# Patient Record
Sex: Female | Born: 1980 | Race: Black or African American | Hispanic: No | Marital: Married | Smoking: Never smoker
Health system: Southern US, Community
[De-identification: ages and names within clinical notes are randomized; demographics above are authoritative.]

## PROBLEM LIST (undated history)

## (undated) DIAGNOSIS — E282 Polycystic ovarian syndrome: Secondary | ICD-10-CM

## (undated) DIAGNOSIS — Z202 Contact with and (suspected) exposure to infections with a predominantly sexual mode of transmission: Secondary | ICD-10-CM

## (undated) DIAGNOSIS — G43909 Migraine, unspecified, not intractable, without status migrainosus: Secondary | ICD-10-CM

## (undated) HISTORY — DX: Contact with and (suspected) exposure to infections with a predominantly sexual mode of transmission: Z20.2

## (undated) HISTORY — DX: Polycystic ovarian syndrome: E28.2

## (undated) HISTORY — DX: Migraine, unspecified, not intractable, without status migrainosus: G43.909

## (undated) HISTORY — PX: NO PAST SURGERIES: SHX2092

---

## 2002-03-18 ENCOUNTER — Other Ambulatory Visit: Admission: RE | Admit: 2002-03-18 | Discharge: 2002-03-18 | Payer: Self-pay | Admitting: Obstetrics and Gynecology

## 2004-05-03 ENCOUNTER — Other Ambulatory Visit: Admission: RE | Admit: 2004-05-03 | Discharge: 2004-05-03 | Payer: Self-pay | Admitting: Obstetrics and Gynecology

## 2005-07-19 ENCOUNTER — Emergency Department (HOSPITAL_COMMUNITY): Admission: EM | Admit: 2005-07-19 | Discharge: 2005-07-19 | Payer: Self-pay | Admitting: Emergency Medicine

## 2005-08-14 DIAGNOSIS — G43909 Migraine, unspecified, not intractable, without status migrainosus: Secondary | ICD-10-CM

## 2005-08-14 HISTORY — DX: Migraine, unspecified, not intractable, without status migrainosus: G43.909

## 2006-02-09 ENCOUNTER — Other Ambulatory Visit: Admission: RE | Admit: 2006-02-09 | Discharge: 2006-02-09 | Payer: Self-pay | Admitting: Obstetrics & Gynecology
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
## 2007-08-15 DIAGNOSIS — Z202 Contact with and (suspected) exposure to infections with a predominantly sexual mode of transmission: Secondary | ICD-10-CM

## 2007-08-15 HISTORY — DX: Contact with and (suspected) exposure to infections with a predominantly sexual mode of transmission: Z20.2

## 2008-03-23 ENCOUNTER — Other Ambulatory Visit: Admission: RE | Admit: 2008-03-23 | Discharge: 2008-03-23 | Payer: Self-pay | Admitting: Obstetrics and Gynecology

## 2008-05-18 ENCOUNTER — Other Ambulatory Visit: Admission: RE | Admit: 2008-05-18 | Discharge: 2008-05-18 | Payer: Self-pay | Admitting: Obstetrics and Gynecology

## 2012-11-27 ENCOUNTER — Ambulatory Visit (INDEPENDENT_AMBULATORY_CARE_PROVIDER_SITE_OTHER): Payer: BC Managed Care – PPO | Admitting: Certified Nurse Midwife

## 2012-11-27 ENCOUNTER — Encounter: Payer: Self-pay | Admitting: Certified Nurse Midwife

## 2012-11-27 VITALS — BP 94/60 | Ht 61.25 in | Wt 109.0 lb

## 2012-11-27 DIAGNOSIS — Z01419 Encounter for gynecological examination (general) (routine) without abnormal findings: Secondary | ICD-10-CM

## 2012-11-27 DIAGNOSIS — Z0000 Encounter for general adult medical examination without abnormal findings: Secondary | ICD-10-CM

## 2012-11-27 LAB — POCT URINALYSIS DIPSTICK
Bilirubin, UA: NEGATIVE
Blood, UA: NEGATIVE
Glucose, UA: NEGATIVE
Ketones, UA: NEGATIVE
Urobilinogen, UA: NEGATIVE

## 2012-11-27 LAB — CBC
MCV: 91.2 fL (ref 78.0–100.0)
Platelets: 293 10*3/uL (ref 150–400)
RDW: 13.1 % (ref 11.5–15.5)
WBC: 2.3 10*3/uL — ABNORMAL LOW (ref 4.0–10.5)

## 2012-11-27 LAB — HIV ANTIBODY (ROUTINE TESTING W REFLEX): HIV: NONREACTIVE

## 2012-11-27 LAB — RPR

## 2012-11-27 NOTE — Progress Notes (Signed)
32 y.o. Single African American female   G0P0000 here for annual exam.  Periods normal with cycles  Every 28 to 36 days, duration 4-5 days, moderate to light.  No issues.  No health issues in past year or today. Teaching going well. 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  Patient's last menstrual period was 10/21/2012.          Sexually active: no  The current method of family planning is abstinence.    Exercising: yes  running & walking Last mammogram: none Last pap: 05-05-10 neg Last BMD: none Alcohol: none Tobacco: none colonoscopy   No health maintenance topics applied.  Family History  Problem Relation Age of Onset  . Diabetes Mother   . Hypertension Father   . Hyperlipidemia Father   . Diabetes Maternal Grandmother   . Diabetes Paternal Grandfather     There is no problem list on file for this patient.   Past Medical History  Diagnosis Date  . Migraines 2007    per pt none now  . Chlamydia contact, treated 2009    History reviewed. No pertinent past surgical history.  Allergies: Review of patient's allergies indicates no known allergies.  No current outpatient prescriptions on file.   No current facility-administered medications for this visit.    ROS: Pertinent items are noted in HPI.  Exam:    BP 94/60  Ht 5' 1.25" (1.556 m)  Wt 109 lb (49.442 kg)  BMI 20.42 kg/m2  LMP 10/21/2012 Weight change: @WEIGHTCHANGE @ Last 3 height recordings:  Ht Readings from Last 3 Encounters:  11/27/12 5' 1.25" (1.556 m)   General appearance: alert and cooperative Head: Normocephalic, without obvious abnormality, atraumatic Neck: no adenopathy, supple, symmetrical, trachea midline and thyroid not enlarged, symmetric, no tenderness/mass/nodules Lungs: clear to auscultation bilaterally Breasts: normal appearance, no masses or tenderness Heart: regular rate and rhythm Abdomen: soft, non-tender; bowel sounds normal; no masses,  no  organomegaly Extremities: extremities normal, atraumatic, no cyanosis or edema Skin: Skin color, texture, turgor normal. No rashes or lesions Lymph nodes: Cervical, supraclavicular, and axillary nodes normal. no inguinal nodes palpated Neurologic: Alert and oriented X 3, normal strength and tone. Normal symmetric reflexes. Normal coordination and gait   Pelvic: External genitalia:  no lesions              Urethra: normal appearing urethra with no masses, tenderness or lesions              Bartholins and Skenes: Bartholin's, Urethra, Skene's normal                 Vagina: normal appearing vagina with normal color and discharge, no lesions              Cervix: normal appearance very posterior              Pap taken: yes        Bimanual Exam:  Uterus:  uterus is normal size, shape, consistency and nontender, anteflexed                                      Adnexa:    normal adnexa in size, nontender and no masses  Rectovaginal: Confirms                                      Anus:  normal sphincter tone, no lesions  A: Well woman exam Contraception none needed Desires STD screening     P: Reviewed health and wellness pertinent to exam Pap smear as indicated Lab: HIV,RPR,Hepatitis C  CBC  return annually or prn      An After Visit Summary was printed and given to the patient.  Reviewed, TL

## 2012-11-27 NOTE — Patient Instructions (Signed)

## 2012-11-28 ENCOUNTER — Telehealth: Payer: Self-pay | Admitting: *Deleted

## 2012-11-28 LAB — HEMOGLOBIN, FINGERSTICK: Hemoglobin, fingerstick: 13.9 g/dL (ref 12.0–16.0)

## 2012-11-28 NOTE — Telephone Encounter (Signed)
Left Message To Call Back  

## 2012-11-28 NOTE — Telephone Encounter (Signed)
Patient aware of results.

## 2012-11-28 NOTE — Telephone Encounter (Signed)
Message copied by Lorraine Lax on Thu Nov 28, 2012  3:18 PM ------      Message from: Verner Chol      Created: Thu Nov 28, 2012  8:35 AM       Notify patient RPR, HIV and Hep C negative      CBC all normal except WBC slightly low, needs repeat in 2 weeks ------

## 2012-11-29 LAB — IPS PAP TEST WITH REFLEX TO HPV

## 2012-12-20 ENCOUNTER — Other Ambulatory Visit (INDEPENDENT_AMBULATORY_CARE_PROVIDER_SITE_OTHER): Payer: BC Managed Care – PPO

## 2012-12-20 ENCOUNTER — Other Ambulatory Visit: Payer: Self-pay | Admitting: Certified Nurse Midwife

## 2012-12-20 DIAGNOSIS — R6889 Other general symptoms and signs: Secondary | ICD-10-CM

## 2012-12-21 LAB — CBC
MCH: 30.5 pg (ref 26.0–34.0)
MCHC: 34.4 g/dL (ref 30.0–36.0)
Platelets: 286 10*3/uL (ref 150–400)
RBC: 4.07 MIL/uL (ref 3.87–5.11)

## 2012-12-23 ENCOUNTER — Telehealth: Payer: Self-pay

## 2012-12-23 NOTE — Telephone Encounter (Signed)
lmtcb to give lab results. 

## 2012-12-23 NOTE — Telephone Encounter (Signed)
Message copied by Eliezer Bottom on Mon Dec 23, 2012  1:34 PM ------      Message from: Verner Chol      Created: Mon Dec 23, 2012  8:18 AM       Notify CBC normal, WBC count normal now       ------

## 2012-12-24 NOTE — Telephone Encounter (Signed)
Patient notified of results.

## 2013-08-27 ENCOUNTER — Encounter: Payer: Self-pay | Admitting: Certified Nurse Midwife

## 2013-08-27 ENCOUNTER — Ambulatory Visit (INDEPENDENT_AMBULATORY_CARE_PROVIDER_SITE_OTHER): Payer: BC Managed Care – PPO | Admitting: Certified Nurse Midwife

## 2013-08-27 VITALS — BP 100/64 | HR 64 | Resp 16 | Ht 61.25 in | Wt 114.0 lb

## 2013-08-27 DIAGNOSIS — Z202 Contact with and (suspected) exposure to infections with a predominantly sexual mode of transmission: Secondary | ICD-10-CM

## 2013-08-27 DIAGNOSIS — Z3009 Encounter for other general counseling and advice on contraception: Secondary | ICD-10-CM

## 2013-08-27 NOTE — Progress Notes (Signed)
  32 yrs Single [place] female G0P0000 here for STD screening. Patient in new relationship and partner and she plan to share results with each other. No concerns of STD exposure. Patient denies any vaginal concerns or health concerns. Patient would also like information on Depo Provera. She desires contraception and interested in Depo.   Patient's last menstrual period was 08/14/2013.    Contraception: not sexually active                                            General:Healthy female WDWN Affect: normal, orientation X 3  Abdomen: soft non-tender Lymph groin:Normal   Pelvic Exam:EGBUS normal. Vulva reveals no erythema, lesions or atrophic changes. Vagina normal, no lesions, polyps or discharge.  Cervix is normal, smooth pink mucosa, no friability, nontender, no CMT, no lesions, no polyps.  Uterus:normal:  Adnexae:; not tender no masses Perineal area: no lesions noted Rectal: no lesions noted Specimens collected                    Assessment: Normal Pelvic Exam              Contraception:condoms desires Depo Provera STD Screening     Plan :Reviewed findings of normal exam Discussed risks and benefits of Depo Provera, bleeding profile expectations, possible weight change and can affect time of return to fertility with stops. Questions addressed. Patient to call on first day of period to schedule to come in for  Depo Provera. Rx Depo Provera every 3 months x 3 refills Labs: GC,Chl,HIV RPR            STD prevention reviewed.  Questions addressed.             RV prn    :   :      :

## 2013-08-28 LAB — HIV ANTIBODY (ROUTINE TESTING W REFLEX): HIV: NONREACTIVE

## 2013-08-28 LAB — HSV(HERPES SIMPLEX VRS) I + II AB-IGG
HSV 1 Glycoprotein G Ab, IgG: 0.11 IV
HSV 2 GLYCOPROTEIN G AB, IGG: 0.21 IV

## 2013-08-28 LAB — HEPATITIS C ANTIBODY: HCV AB: NEGATIVE

## 2013-08-28 LAB — RPR

## 2013-08-29 LAB — IPS N GONORRHOEA AND CHLAMYDIA BY PCR

## 2013-08-31 NOTE — Progress Notes (Signed)
Reviewed personally.  M. Suzanne Glenford Garis, MD.  

## 2013-09-01 ENCOUNTER — Telehealth: Payer: Self-pay

## 2013-09-01 NOTE — Telephone Encounter (Signed)
Message copied by Eliezer BottomJOHNSON, DAVINA J on Mon Sep 01, 2013 10:26 AM ------      Message from: Verner CholLEONARD, DEBORAH S      Created: Mon Sep 01, 2013  8:34 AM       Notify patient that HIV,RPR,GC,Chlamydia, HSV 1,2 and Hep.C all negative ------

## 2013-09-01 NOTE — Telephone Encounter (Signed)
lmtcb

## 2013-09-01 NOTE — Telephone Encounter (Signed)
Patient notified of results.

## 2013-09-23 ENCOUNTER — Ambulatory Visit (INDEPENDENT_AMBULATORY_CARE_PROVIDER_SITE_OTHER): Payer: BC Managed Care – PPO | Admitting: *Deleted

## 2013-09-23 VITALS — BP 120/72 | HR 76 | Ht 61.25 in | Wt 110.0 lb

## 2013-09-23 DIAGNOSIS — Z3040 Encounter for surveillance of contraceptives, unspecified: Secondary | ICD-10-CM

## 2013-09-23 LAB — POCT URINE PREGNANCY: PREG TEST UR: NEGATIVE

## 2013-09-23 MED ORDER — MEDROXYPROGESTERONE ACETATE 150 MG/ML IM SUSP
150.0000 mg | Freq: Once | INTRAMUSCULAR | Status: AC
  Administered 2013-09-23: 150 mg via INTRAMUSCULAR

## 2013-09-23 NOTE — Progress Notes (Signed)
Patient ID: Sabrina SailsAlexis Richardson, female   DOB: 11/01/1980, 33 y.o.   MRN: [id number] Pt arrived for first Depo Provera injection.  Pt LMP 09/20/13 and UPT in office today was negative.  Pt has not been SA since last LMP.  Pt tolerated injection well in right gluteal. Last AEX - 11/27/12 Pt should return between 12/09/13 and 12/23/13.  Pt has AEX appt on 12/04/13.  Advised pt on date range for next injection.  Explained to pt that she can be a few days early for injection, but she can never be late for injection.  Pt voices understanding.  Advised pt OK to have injection at [facility] that is already scheduled.

## 2013-12-03 ENCOUNTER — Telehealth: Payer: Self-pay | Admitting: Certified Nurse Midwife

## 2013-12-03 NOTE — Telephone Encounter (Signed)
LMTCB to reschedule her AEX with D. [facility]

## 2013-12-03 NOTE — Telephone Encounter (Signed)
Patient called and rescheduled

## 2013-12-04 ENCOUNTER — Ambulatory Visit: Payer: BC Managed Care – PPO | Admitting: Certified Nurse Midwife

## 2013-12-18 ENCOUNTER — Encounter: Payer: Self-pay | Admitting: Certified Nurse Midwife

## 2013-12-18 ENCOUNTER — Ambulatory Visit (INDEPENDENT_AMBULATORY_CARE_PROVIDER_SITE_OTHER): Payer: BC Managed Care – PPO | Admitting: Certified Nurse Midwife

## 2013-12-18 VITALS — BP 110/68 | HR 72 | Resp 16 | Ht 61.5 in | Wt 111.0 lb

## 2013-12-18 DIAGNOSIS — Z3041 Encounter for surveillance of contraceptive pills: Secondary | ICD-10-CM

## 2013-12-18 DIAGNOSIS — Z3040 Encounter for surveillance of contraceptives, unspecified: Secondary | ICD-10-CM

## 2013-12-18 DIAGNOSIS — Z124 Encounter for screening for malignant neoplasm of cervix: Secondary | ICD-10-CM

## 2013-12-18 DIAGNOSIS — Z01419 Encounter for gynecological examination (general) (routine) without abnormal findings: Secondary | ICD-10-CM

## 2013-12-18 DIAGNOSIS — Z309 Encounter for contraceptive management, unspecified: Secondary | ICD-10-CM

## 2013-12-18 DIAGNOSIS — Z0000 Encounter for general adult medical examination without abnormal findings: Secondary | ICD-10-CM

## 2013-12-18 LAB — POCT URINALYSIS DIPSTICK
Bilirubin, UA: NEGATIVE
Blood, UA: NEGATIVE
GLUCOSE UA: NEGATIVE
KETONES UA: NEGATIVE
LEUKOCYTES UA: NEGATIVE
Nitrite, UA: NEGATIVE
Protein, UA: NEGATIVE
Urobilinogen, UA: NEGATIVE
pH, UA: 5

## 2013-12-18 LAB — HEMOGLOBIN, FINGERSTICK: HEMOGLOBIN, FINGERSTICK: 13.4 g/dL (ref 12.0–16.0)

## 2013-12-18 MED ORDER — MEDROXYPROGESTERONE ACETATE 150 MG/ML IM SUSP: 150.0000 mg | INTRAMUSCULAR | Status: DC

## 2013-12-18 MED ORDER — MEDROXYPROGESTERONE ACETATE 150 MG/ML IM SUSP
150.0000 mg | Freq: Once | INTRAMUSCULAR | Status: AC
  Administered 2013-12-18: 150 mg via INTRAMUSCULAR

## 2013-12-18 NOTE — Progress Notes (Signed)
33 y.o. G0P0000 Single African American Fe here for annual exam. Periods normal, no issues just spotting. No partner change, no STD screening needed. Happy with Depo Provera use for contraception. Due today. Sees PCP only prn. Desires screening labs today. No other health issues today.  Patient's last menstrual period was 12/14/2013.          Sexually active: yes  The current method of family planning is Depo-Provera injections.    Exercising: yes  walking,running & cross train Smoker:  no  Health Maintenance: Pap: 11-27-12 neg MMG:  none Colonoscopy:  none BMD:   none TDaP:  2013 Labs: Poct urine-neg, Hgb-13.4 Self breast exam: done occ   reports that she has never smoked. She does not have any smokeless tobacco history on file. She reports that she does not drink alcohol or use illicit drugs.  Past Medical History  Diagnosis Date  . Migraines 2007    per pt none now  . Chlamydia contact, treated 2009    History reviewed. No pertinent past surgical history.  Current Outpatient Prescriptions  Medication Sig Dispense Refill  . medroxyPROGESTERone (DEPO-PROVERA) 150 MG/ML injection Inject 150 mg into the muscle every 3 (three) months.       No current facility-administered medications for this visit.    Family History  Problem Relation Age of Onset  . Diabetes Mother   . Hypertension Father   . Hyperlipidemia Father   . Diabetes Maternal Grandmother   . Diabetes Paternal Grandfather     ROS:  Pertinent items are noted in HPI.  Otherwise, a comprehensive ROS was negative.  Exam:   BP 110/68  Pulse 72  Resp 16  Ht 5' 1.5" (1.562 m)  Wt 111 lb (50.349 kg)  BMI 20.64 kg/m2  LMP 12/14/2013 Height: 5' 1.5" (156.2 cm)  Ht Readings from Last 3 Encounters:  12/18/13 5' 1.5" (1.562 m)  09/23/13 5' 1.25" (1.556 m)  08/27/13 5' 1.25" (1.556 m)    General appearance: alert, cooperative and appears stated age Head: Normocephalic, without obvious abnormality,  atraumatic Neck: no adenopathy, supple, symmetrical, trachea midline and thyroid normal to inspection and palpation Lungs: clear to auscultation bilaterally Breasts: normal appearance, no masses or tenderness, No nipple retraction or dimpling, No nipple discharge or bleeding, No axillary or supraclavicular adenopathy Heart: regular rate and rhythm Abdomen: soft, non-tender; no masses,  no organomegaly Extremities: extremities normal, atraumatic, no cyanosis or edema Skin: Skin color, texture, turgor normal. No rashes or lesions Lymph nodes: Cervical, supraclavicular, and axillary nodes normal. No abnormal inguinal nodes palpated Neurologic: Grossly normal   Pelvic: External genitalia:  no lesions              Urethra:  normal appearing urethra with no masses, tenderness or lesions              Bartholin's and Skene's: normal                 Vagina: normal appearing vagina with normal color and discharge, no lesions, small superficial laceration at introitus, non tender, no bleeding noted, non tender              Cervix: normal, non tender              Pap taken: yes Bimanual Exam:  Uterus:  normal size, contour, position, consistency, mobility, non-tender, anteverted and anteflexed              Adnexa: normal adnexa and no mass, fullness, tenderness  Rectovaginal: Confirms               Anus:  normal sphincter tone, no lesions  A:  Well Woman with normal exam  Contraception Depo Provera desired  Screening labs  Vaginal superficial laceration  P:   Reviewed health and wellness pertinent to exam  Rx Depo Provera 150 mg IM every 3 months x 1 year  Pap smear taken today with HPVHR  Labs: Lipid panel, TSH  Patient shown findings and encouraged to decrease hand stimulation to avoid trauma to area with stimulation and can use Olive Oil to area to soothe until healed. Questions addressed.   counseled on breast self exam, STD prevention, HIV risk factors and prevention,  adequate intake of calcium and vitamin D, diet and exercise  return annually or prn  An After Visit Summary was printed and given to the patient.

## 2013-12-18 NOTE — Patient Instructions (Signed)
General topics  Next pap or exam is  due in 1 year Take a Women's multivitamin Take 1200 mg. of calcium daily - prefer dietary If any concerns in interim to call back  Breast Self-Awareness Practicing breast self-awareness may pick up problems early, prevent significant medical complications, and possibly save your life. By practicing breast self-awareness, you can become familiar with how your breasts look and feel and if your breasts are changing. This allows you to notice changes early. It can also offer you some reassurance that your breast health is good. One way to learn what is normal for your breasts and whether your breasts are changing is to do a breast self-exam. If you find a lump or something that was not present in the past, it is best to contact your caregiver right away. Other findings that should be evaluated by your caregiver include nipple discharge, especially if it is bloody; skin changes or reddening; areas where the skin seems to be pulled in (retracted); or new lumps and bumps. Breast pain is seldom associated with cancer (malignancy), but should also be evaluated by a caregiver. BREAST SELF-EXAM The best time to examine your breasts is 5 7 days after your menstrual period is over.  ExitCare Patient Information 2013 [employer].   Exercise to Stay Healthy Exercise helps you become and stay healthy. EXERCISE IDEAS AND TIPS Choose exercises that:  You enjoy.  Fit into your day. You do not need to exercise really hard to be healthy. You can do exercises at a slow or medium level and stay healthy. You can:  Stretch before and after working out.  Try yoga, Pilates, or tai chi.  Lift weights.  Walk fast, swim, jog, run, climb stairs, bicycle, dance, or rollerskate.  Take aerobic classes. Exercises that burn about 150 calories:  Running 1  miles in 15 minutes.  Playing volleyball for 45 to 60 minutes.  Washing and waxing a car for 45 to 60  minutes.  Playing touch football for 45 minutes.  Walking 1  miles in 35 minutes.  Pushing a stroller 1  miles in 30 minutes.  Playing basketball for 30 minutes.  Raking leaves for 30 minutes.  Bicycling 5 miles in 30 minutes.  Walking 2 miles in 30 minutes.  Dancing for 30 minutes.  Shoveling snow for 15 minutes.  Swimming laps for 20 minutes.  Walking up stairs for 15 minutes.  Bicycling 4 miles in 15 minutes.  Gardening for 30 to 45 minutes.  Jumping rope for 15 minutes.  Washing windows or floors for 45 to 60 minutes. Document Released: 09/02/2010 Document Revised: 10/23/2011 Document Reviewed: 09/02/2010 ExitCare Patient Information 2013 ExitCare[employer].   Other topics ( that may be useful information):    Sexually Transmitted Disease Sexually transmitted disease (STD) refers to any infection that is passed from person to person during sexual activity. This may happen by way of saliva, semen, blood, vaginal mucus, or urine. Common STDs include:  Gonorrhea.  Chlamydia.  Syphilis.  HIV/AIDS.  Genital herpes.  Hepatitis B and C.  Trichomonas.  Human papillomavirus (HPV).  Pubic lice. CAUSES  An STD may be spread by bacteria, virus, or parasite. A person can get an STD by:  Sexual intercourse with an infected person.  Sharing sex toys with an infected person.  Sharing needles with an infected person.  Having intimate contact with the genitals, mouth, or rectal areas of an infected person. SYMPTOMS  Some people may not have any symptoms, but   they can still pass the infection to others. Different STDs have different symptoms. Symptoms include:  Painful or bloody urination.  Pain in the pelvis, abdomen, vagina, anus, throat, or eyes.  Skin rash, itching, irritation, growths, or sores (lesions). These usually occur in the genital or anal area.  Abnormal vaginal discharge.  Penile discharge in men.  Soft, flesh-colored skin growths in the  genital or anal area.  Fever.  Pain or bleeding during sexual intercourse.  Swollen glands in the groin area.  Yellow skin and eyes (jaundice). This is seen with hepatitis. DIAGNOSIS  To make a diagnosis, your caregiver may:  Take a medical history.  Perform a physical exam.  Take a specimen (culture) to be examined.  Examine a sample of discharge under a microscope.  Perform blood test TREATMENT   Chlamydia, gonorrhea, trichomonas, and syphilis can be cured with antibiotic medicine.  Genital herpes, hepatitis, and HIV can be treated, but not cured, with prescribed medicines. The medicines will lessen the symptoms.  Genital warts from HPV can be treated with medicine or by freezing, burning (electrocautery), or surgery. Warts may come back.  HPV is a virus and cannot be cured with medicine or surgery.However, abnormal areas may be followed very closely by your caregiver and may be removed from the cervix, vagina, or vulva through office procedures or surgery. If your diagnosis is confirmed, your recent sexual partners need treatment. This is true even if they are symptom-free or have a negative culture or evaluation. They should not have sex until their caregiver says it is okay. HOME CARE INSTRUCTIONS  All sexual partners should be informed, tested, and treated for all STDs.  Take your antibiotics as directed. Finish them even if you start to feel better.  Only take over-the-counter or prescription medicines for pain, discomfort, or fever as directed by your caregiver.  Rest.  Eat a balanced diet and drink enough fluids to keep your urine clear or pale yellow.  Do not have sex until treatment is completed and you have followed up with your caregiver. STDs should be checked after treatment.  Keep all follow-up appointments, Pap tests, and blood tests as directed by your caregiver.  Only use latex condoms and water-soluble lubricants during sexual activity. Do not use  petroleum jelly or oils.  Avoid alcohol and illegal drugs.  Get vaccinated for HPV and hepatitis. If you have not received these vaccines in the past, talk to your caregiver about whether one or both might be right for you.  Avoid risky sex practices that can break the skin. The only way to avoid getting an STD is to avoid all sexual activity.Latex condoms and dental dams (for oral sex) will help lessen the risk of getting an STD, but will not completely eliminate the risk. SEEK MEDICAL CARE IF:   You have a fever.  You have any new or worsening symptoms. Document Released: 10/21/2002 Document Revised: 10/23/2011 Document Reviewed: 10/28/2010 [facility] Patient Information 2013 [employer].    Domestic Abuse You are being battered or abused if someone close to you hits, pushes, or physically hurts you in any way. You also are being abused if you are forced into activities. You are being sexually abused if you are forced to have sexual contact of any kind. You are being emotionally abused if you are made to feel worthless or if you are constantly threatened. It is important to remember that help is available. No one has the right to abuse you. PREVENTION OF FURTHER  ABUSE  Learn the warning signs of danger. This varies with situations but may include: the use of alcohol, threats, isolation from friends and family, or forced sexual contact. Leave if you feel that violence is going to occur.  If you are attacked or beaten, report it to the police so the abuse is documented. You do not have to press charges. The police can protect you while you or the attackers are leaving. Get the officer's name and badge number and a copy of the report.  Find someone you can trust and tell them what is happening to you: your caregiver, a nurse, clergy member, close friend or family member. Feeling ashamed is natural, but remember that you have done nothing wrong. No one deserves abuse. Document Released:  07/28/2000 Document Revised: 10/23/2011 Document Reviewed: 10/06/2010 ExitCare Patient Information 2013 [employer].    How Much is Too Much Alcohol? Drinking too much alcohol can cause injury, accidents, and health problems. These types of problems can include:   Car crashes.  Falls.  Family fighting (domestic violence).  Drowning.  Fights.  Injuries.  Burns.  Damage to certain organs.  Having a baby with birth defects. ONE DRINK CAN BE TOO MUCH WHEN YOU ARE:  Working.  Pregnant or breastfeeding.  Taking medicines. Ask your doctor.  Driving or planning to drive. If you or someone you know has a drinking problem, get help from a doctor.  Document Released: 05/27/2009 Document Revised: 10/23/2011 Document Reviewed: 05/27/2009 ExitCare Patient Information 2013 [employer]Care[employer].   Smoking Hazards Smoking cigarettes is extremely bad for your health. Tobacco smoke has over 200 known poisons in it. There are over 60 chemicals in tobacco smoke that cause cancer. Some of the chemicals found in cigarette smoke include:   Cyanide.  Benzene.  Formaldehyde.  Methanol (wood alcohol).  Acetylene (fuel used in welding torches).  Ammonia. Cigarette smoke also contains the poisonous gases nitrogen oxide and carbon monoxide.  Cigarette smokers have an increased risk of many serious medical problems and Smoking causes approximately:  90% of all lung cancer deaths in men.  80% of all lung cancer deaths in women.  90% of deaths from chronic obstructive lung disease. Compared with nonsmokers, smoking increases the risk of:  Coronary heart disease by 2 to 4 times.  Stroke by 2 to 4 times.  Men developing lung cancer by 23 times.  Women developing lung cancer by 13 times.  Dying from chronic obstructive lung diseases by 12 times.  . Smoking is the most preventable cause of death and disease in our society.  WHY IS SMOKING ADDICTIVE?  Nicotine is the chemical  agent in tobacco that is capable of causing addiction or dependence.  When you smoke and inhale, nicotine is absorbed rapidly into the bloodstream through your lungs. Nicotine absorbed through the lungs is capable of creating a powerful addiction. Both inhaled and non-inhaled nicotine may be addictive.  Addiction studies of cigarettes and spit tobacco show that addiction to nicotine occurs mainly during the teen years, when young people begin using tobacco products. WHAT ARE THE BENEFITS OF QUITTING?  There are many health benefits to quitting smoking.   Likelihood of developing cancer and heart disease decreases. Health improvements are seen almost immediately.  Blood pressure, pulse rate, and breathing patterns start returning to normal soon after quitting. QUITTING SMOKING   American Lung Association - 1-800-LUNGUSA  American Cancer Society - 1-800-ACS-2345 Document Released: 09/07/2004 Document Revised: 10/23/2011 Document Reviewed: 05/12/2009 ExitCare Patient Information 2013 ExitCare,   LLC.   Stress Management Stress is a state of physical or mental tension that often results from changes in your life or normal routine. Some common causes of stress are:  Death of a loved one.  Injuries or severe illnesses.  Getting fired or changing jobs.  Moving into a new home. Other causes may be:  Sexual problems.  Business or financial losses.  Taking on a large debt.  Regular conflict with someone at home or at work.  Constant tiredness from lack of sleep. It is not just bad things that are stressful. It may be stressful to:  Win the lottery.  Get married.  Buy a new car. The amount of stress that can be easily tolerated varies from person to person. Changes generally cause stress, regardless of the types of change. Too much stress can affect your health. It may lead to physical or emotional problems. Too little stress (boredom) may also become stressful. SUGGESTIONS TO  REDUCE STRESS:  Talk things over with your family and friends. It often is helpful to share your concerns and worries. If you feel your problem is serious, you may want to get help from a professional counselor.  Consider your problems one at a time instead of lumping them all together. Trying to take care of everything at once may seem impossible. List all the things you need to do and then start with the most important one. Set a goal to accomplish 2 or 3 things each day. If you expect to do too many in a single day you will naturally fail, causing you to feel even more stressed.  Do not use alcohol or drugs to relieve stress. Although you may feel better for a short time, they do not remove the problems that caused the stress. They can also be habit forming.  Exercise regularly - at least 3 times per week. Physical exercise can help to relieve that "uptight" feeling and will relax you.  The shortest distance between despair and hope is often a good night's sleep.  Go to bed and get up on time allowing yourself time for appointments without being rushed.  Take a short "time-out" period from any stressful situation that occurs during the day. Close your eyes and take some deep breaths. Starting with the muscles in your face, tense them, hold it for a few seconds, then relax. Repeat this with the muscles in your neck, shoulders, hand, stomach, back and legs.  Take good care of yourself. Eat a balanced diet and get plenty of rest.  Schedule time for having fun. Take a break from your daily routine to relax. HOME CARE INSTRUCTIONS   Call if you feel overwhelmed by your problems and feel you can no longer manage them on your own.  Return immediately if you feel like hurting yourself or someone else. Document Released: 01/24/2001 Document Revised: 10/23/2011 Document Reviewed: 09/16/2007 ExitCare Patient Information 2013 ExitCare, [employer].   

## 2013-12-19 LAB — LIPID PANEL
CHOLESTEROL: 145 mg/dL (ref 0–200)
HDL: 47 mg/dL (ref >39)
LDL Cholesterol: 85 mg/dL (ref 0–99)
TRIGLYCERIDES: 67 mg/dL (ref <150)
Total CHOL/HDL Ratio: 3.1 Ratio
VLDL: 13 mg/dL (ref 0–40)

## 2013-12-19 LAB — TSH: TSH: 0.751 u[IU]/mL (ref 0.350–4.500)

## 2013-12-19 NOTE — Progress Notes (Signed)
Reviewed personally.  M. Suzanne Doron Shake, MD.  

## 2013-12-22 LAB — IPS PAP TEST WITH HPV

## 2014-03-18 ENCOUNTER — Encounter: Payer: Self-pay | Admitting: *Deleted

## 2014-03-18 ENCOUNTER — Ambulatory Visit (INDEPENDENT_AMBULATORY_CARE_PROVIDER_SITE_OTHER): Payer: BC Managed Care – PPO | Admitting: *Deleted

## 2014-03-18 VITALS — BP 104/60 | HR 92 | Ht 61.5 in | Wt 118.0 lb

## 2014-03-18 DIAGNOSIS — Z3040 Encounter for surveillance of contraceptives, unspecified: Secondary | ICD-10-CM

## 2014-03-18 MED ORDER — MEDROXYPROGESTERONE ACETATE 150 MG/ML IM SUSP
150.0000 mg | Freq: Once | INTRAMUSCULAR | Status: AC
  Administered 2014-03-18: 150 mg via INTRAMUSCULAR

## 2014-03-18 NOTE — Patient Instructions (Signed)
Please return between 06/03/14 and 06/17/14 for your next injection

## 2014-03-18 NOTE — Progress Notes (Signed)
Patient ID: Sabrina SailsAlexis Richardson, female   DOB: 04/13/1981, 33 y.o.   MRN: [id number] Pt arrived for Depo Provera injection.  Pt tolerated injection well in right gluteal. Last AEX - 12/18/13 Last Depo Provera Given - 12/18/13 Pt is within due dates. Pt should return between 06/03/14 and 06/17/14

## 2014-06-04 ENCOUNTER — Ambulatory Visit: Payer: BC Managed Care – PPO

## 2014-06-05 ENCOUNTER — Ambulatory Visit (INDEPENDENT_AMBULATORY_CARE_PROVIDER_SITE_OTHER): Payer: BC Managed Care – PPO

## 2014-06-05 VITALS — BP 114/76 | HR 72 | Ht 61.5 in | Wt 120.0 lb

## 2014-06-05 DIAGNOSIS — Z3040 Encounter for surveillance of contraceptives, unspecified: Secondary | ICD-10-CM

## 2014-06-05 MED ORDER — MEDROXYPROGESTERONE ACETATE 150 MG/ML IM SUSP
150.0000 mg | Freq: Once | INTRAMUSCULAR | Status: AC
  Administered 2014-06-05: 150 mg via INTRAMUSCULAR

## 2014-06-05 NOTE — Progress Notes (Signed)
Pt here for Depo Provera 150mg  Injection. Last AEX 12/18/13. Last Depo given was 03/18/14. Next Depo due between Jan 8-Sep 04, 2014. Injection was given on the Left upper quadrant.  Pt tolerated injection well

## 2014-08-24 ENCOUNTER — Ambulatory Visit (INDEPENDENT_AMBULATORY_CARE_PROVIDER_SITE_OTHER): Payer: BC Managed Care – PPO | Admitting: *Deleted

## 2014-08-24 VITALS — BP 106/70 | HR 80 | Resp 18 | Ht 61.5 in | Wt 122.4 lb

## 2014-08-24 DIAGNOSIS — Z3040 Encounter for surveillance of contraceptives, unspecified: Secondary | ICD-10-CM

## 2014-08-24 MED ORDER — MEDROXYPROGESTERONE ACETATE 150 MG/ML IM SUSP
150.0000 mg | Freq: Once | INTRAMUSCULAR | Status: AC
  Administered 2014-08-24: 150 mg via INTRAMUSCULAR

## 2014-08-24 NOTE — Progress Notes (Signed)
Patient is within Depo Provera Calender Limits- 1/8-1/22/16 Next Depo Due between: 3/29-4/12/16 Last AEX: 12/19/14 with Ms. Debbie AEX Scheduled: 12/31/14 with Ms. Debbie  Patient is aware.  Pt tolerated Injection well.  Routed to provider for review, encounter closed.

## 2014-10-30 ENCOUNTER — Telehealth: Payer: Self-pay | Admitting: Certified Nurse Midwife

## 2014-10-30 NOTE — Telephone Encounter (Signed)
Spoke with patient. She would like to discuss continuing Depo Provera with Verner Choleborah S. Leonard CNM. Denies active bleeding at this time. Declines earlier appointment than previously scheduled. Appointment changed to provider appointment with Verner Choleborah S. Leonard CNM on 11/10/14 at patient request. She is advised to return call with any concerns prior to appointment and is agreeable. Routing to provider for final review. Patient agreeable to disposition. Will close encounter

## 2014-10-30 NOTE — Telephone Encounter (Signed)
Pt called requesting we turn her appointment for her birth control shot into an appointment because she has some concerns. Pt states shes not sure if shes having side effects from birth control shot - supposed to come for next shot next week.  Stomach discomfort/increased bleeding with cycle/vaginal irritation

## 2014-11-10 ENCOUNTER — Ambulatory Visit (INDEPENDENT_AMBULATORY_CARE_PROVIDER_SITE_OTHER): Payer: BC Managed Care – PPO | Admitting: Certified Nurse Midwife

## 2014-11-10 ENCOUNTER — Encounter: Payer: Self-pay | Admitting: Certified Nurse Midwife

## 2014-11-10 ENCOUNTER — Ambulatory Visit: Payer: BC Managed Care – PPO

## 2014-11-10 VITALS — BP 110/78 | HR 100 | Ht 61.5 in | Wt 121.8 lb

## 2014-11-10 DIAGNOSIS — Z3040 Encounter for surveillance of contraceptives, unspecified: Secondary | ICD-10-CM

## 2014-11-10 DIAGNOSIS — Z3042 Encounter for surveillance of injectable contraceptive: Secondary | ICD-10-CM | POA: Diagnosis not present

## 2014-11-10 MED ORDER — MEDROXYPROGESTERONE ACETATE 150 MG/ML IM SUSP
150.0000 mg | Freq: Once | INTRAMUSCULAR | Status: AC
  Administered 2014-11-10: 150 mg via INTRAMUSCULAR

## 2014-11-10 NOTE — Patient Instructions (Signed)
Bacterial Vaginosis Bacterial vaginosis is a vaginal infection that occurs when the normal balance of bacteria in the vagina is disrupted. It results from an overgrowth of certain bacteria. This is the most common vaginal infection in women of childbearing age. Treatment is important to prevent complications, especially in pregnant women, as it can cause a premature delivery. CAUSES  Bacterial vaginosis is caused by an increase in harmful bacteria that are normally present in smaller amounts in the vagina. Several different kinds of bacteria can cause bacterial vaginosis. However, the reason that the condition develops is not fully understood. RISK FACTORS Certain activities or behaviors can put you at an increased risk of developing bacterial vaginosis, including:  Having a new sex partner or multiple sex partners.  Douching.  Using an intrauterine device (IUD) for contraception. Women do not get bacterial vaginosis from toilet seats, bedding, swimming pools, or contact with objects around them. SIGNS AND SYMPTOMS  Some women with bacterial vaginosis have no signs or symptoms. Common symptoms include:  Grey vaginal discharge.  A fishlike odor with discharge, especially after sexual intercourse.  Itching or burning of the vagina and vulva.  Burning or pain with urination. DIAGNOSIS  Your health care provider will take a medical history and examine the vagina for signs of bacterial vaginosis. A sample of vaginal fluid may be taken. Your health care provider will look at this sample under a microscope to check for bacteria and abnormal cells. A vaginal pH test may also be done.  TREATMENT  Bacterial vaginosis may be treated with antibiotic medicines. These may be given in the form of a pill or a vaginal cream. A second round of antibiotics may be prescribed if the condition comes back after treatment.  HOME CARE INSTRUCTIONS   Only take over-the-counter or prescription medicines as  directed by your health care provider.  If antibiotic medicine was prescribed, take it as directed. Make sure you finish it even if you start to feel better.  Do not have sex until treatment is completed.  Tell all sexual partners that you have a vaginal infection. They should see their health care provider and be treated if they have problems, such as a mild rash or itching.  Practice safe sex by using condoms and only having one sex partner. SEEK MEDICAL CARE IF:   Your symptoms are not improving after 3 days of treatment.  You have increased discharge or pain.  You have a fever. MAKE SURE YOU:   Understand these instructions.  Will watch your condition.  Will get help right away if you are not doing well or get worse. FOR MORE INFORMATION  Centers for Disease Control and Prevention, Division of STD Prevention: www.cdc.gov/std American Sexual Health Association (ASHA): www.ashastd.org  Document Released: 07/31/2005 Document Revised: 05/21/2013 Document Reviewed: 03/12/2013 ExitCare Patient Information 2015 ExitCare, LLC. This information is not intended to replace advice given to you by your health care provider. Make sure you discuss any questions you have with your health care provider.  

## 2014-11-10 NOTE — Progress Notes (Signed)
34 y.o. Single African American G0P0000here for evaluation of Depo Provera  initiated on 09/28/14. for contraception. Consultation only. She also is due for her Depo today. Menses duration usually scant to none, but had moderate to light period 2 weeks ago. She also had vaginal irritation at that time, which has since resolved. Patient was concerned that the bleeding was abnormal.  She had bleeding for about 4 days only which is not abnormal for her not on contraception. No STD concerns, she and partner had screening prior to first sexual activity. Patient denies pain or abnormal discharge or odor, itching or burning.Marland Kitchen. Happy with choice of Depo.  Patient she had noticed brown discharge with sexual activity occasional, but declines exam today. Patient would like to come back in to have checked next week if still occurring. Request Depo today.  O: Healthy female, WD WN Affect: normal orientation X 3    A: Contraception Depo Provera working well, due today Brown discharge noted occasionally, declines vaginal exam today.  P:Discussed bleeding profile expectations with Depo Provera and reassured what she describes is normal. Offered pelvic exam, declined today.. Warning signs of vaginal infection  Reviewed. Questions addressed. Depo Provera 150 mg IM today If brown discharge continues needs pelvic exam.   20 minutes spent with patient in face to face counseling regarding Depo Provera expectations of period profile, vaginal discharge.  RV prn

## 2014-11-11 NOTE — Progress Notes (Signed)
Reviewed personally.  M. Suzanne Naveyah Iacovelli, MD.  

## 2014-11-16 ENCOUNTER — Ambulatory Visit (INDEPENDENT_AMBULATORY_CARE_PROVIDER_SITE_OTHER): Payer: BC Managed Care – PPO | Admitting: Certified Nurse Midwife

## 2014-11-16 ENCOUNTER — Encounter: Payer: Self-pay | Admitting: Certified Nurse Midwife

## 2014-11-16 VITALS — BP 110/70 | HR 70 | Resp 16 | Ht 61.5 in | Wt 120.0 lb

## 2014-11-16 DIAGNOSIS — B3731 Acute candidiasis of vulva and vagina: Secondary | ICD-10-CM

## 2014-11-16 DIAGNOSIS — B373 Candidiasis of vulva and vagina: Secondary | ICD-10-CM | POA: Diagnosis not present

## 2014-11-16 DIAGNOSIS — Z113 Encounter for screening for infections with a predominantly sexual mode of transmission: Secondary | ICD-10-CM

## 2014-11-16 DIAGNOSIS — N898 Other specified noninflammatory disorders of vagina: Secondary | ICD-10-CM

## 2014-11-16 LAB — POCT URINALYSIS DIPSTICK
BILIRUBIN UA: NEGATIVE
Blood, UA: NEGATIVE
Glucose, UA: NEGATIVE
KETONES UA: NEGATIVE
Nitrite, UA: NEGATIVE
PH UA: 5
PROTEIN UA: NEGATIVE
Urobilinogen, UA: NEGATIVE

## 2014-11-16 MED ORDER — TERCONAZOLE 0.4 % VA CREA: 1.0000 | TOPICAL_CREAM | Freq: Every day | VAGINAL | Status: DC

## 2014-11-16 NOTE — Progress Notes (Signed)
34 y.o.Single african [place] female g0p0 here with complaint of vaginal symptoms of irritation with slight increase discharge. Describes discharge as white. Onset of symptoms 2 weeks ago after sexual activity.Patient recently sexually active for the first time with fiance. ? STD concerns. Desires gc, chlamydia, had screening earlier this year in preparation for sexual activity. Urinary symptoms none . Contraception is Depo Provera and condoms. No other health issues today.  O:Healthy female WDWN Affect: normal, orientation x 3  Exam: Abdomen: non tender Lymph node: no enlargement or tenderness Pelvic exam: External genital: normal female BUS: negative Vagina: white, non odorous discharge noted. Ph:4.0   ,Wet prep taken,  Small healing superficial laceration noted on entrance to vagina on left, slightly tender, no bleeding noted, shown to patient Cervix: normal, non tender, no CMT Uterus: normal, non tender Adnexa:normal, non tender, no masses or fullness noted   Wet Prep results: positive for yeast   A:Normal pelvic exam Yeast vaginitis Small superficial laceration in vagina probably due to recently becoming sexually active. STD screening   P:Discussed normal pelvic exam findings Discussed findings of yeast vaginitis and etiology. Discussed Aveeno or baking soda sitz bath for comfort. Avoid moist clothes or pads for extended period of time. If working out in gym clothes or swim suits for long periods of time. Coconut Oil use for skin protection prior to sexual  activity can be used, if Ky jelly is not working.  Discussed laceration finding, not uncommon. Reassured. Rx: Terazol 7 vaginal cream see order with instructions. Discussed if notices condoms are causing issue, may want to change brands. Lab GC, Chlamydia   Patient upon dressing and discussing findings became faint, and was reclined on table. Did not faint, was given Gingerale and crackers and felt much better. Ambulated  after eating with problems and was escorted to front desk with out problems. "I feel fine now". Encouraged to go and eat lunch and increase fluid intake.  Rv prn

## 2014-11-16 NOTE — Patient Instructions (Signed)

## 2014-11-16 NOTE — Progress Notes (Signed)
Reviewed personally.  M. Suzanne Ledarius Leeson, MD.  

## 2014-11-18 ENCOUNTER — Encounter: Payer: Self-pay | Admitting: Certified Nurse Midwife

## 2014-11-18 LAB — IPS N GONORRHOEA AND CHLAMYDIA BY PCR

## 2014-12-31 ENCOUNTER — Ambulatory Visit: Payer: BC Managed Care – PPO | Admitting: Certified Nurse Midwife

## 2015-01-13 ENCOUNTER — Encounter: Payer: Self-pay | Admitting: Nurse Practitioner

## 2015-01-13 ENCOUNTER — Other Ambulatory Visit: Payer: Self-pay | Admitting: Nurse Practitioner

## 2015-01-13 ENCOUNTER — Telehealth: Payer: Self-pay | Admitting: *Deleted

## 2015-01-13 ENCOUNTER — Ambulatory Visit (INDEPENDENT_AMBULATORY_CARE_PROVIDER_SITE_OTHER): Payer: BC Managed Care – PPO | Admitting: Nurse Practitioner

## 2015-01-13 VITALS — BP 108/70 | HR 80 | Resp 16 | Ht 61.0 in | Wt 117.0 lb

## 2015-01-13 DIAGNOSIS — Z1159 Encounter for screening for other viral diseases: Secondary | ICD-10-CM | POA: Diagnosis not present

## 2015-01-13 DIAGNOSIS — Z113 Encounter for screening for infections with a predominantly sexual mode of transmission: Secondary | ICD-10-CM | POA: Diagnosis not present

## 2015-01-13 DIAGNOSIS — R8290 Unspecified abnormal findings in urine: Secondary | ICD-10-CM

## 2015-01-13 DIAGNOSIS — Z01419 Encounter for gynecological examination (general) (routine) without abnormal findings: Secondary | ICD-10-CM

## 2015-01-13 DIAGNOSIS — Z0000 Encounter for general adult medical examination without abnormal findings: Secondary | ICD-10-CM | POA: Diagnosis not present

## 2015-01-13 LAB — POCT URINALYSIS DIPSTICK
Bilirubin, UA: NEGATIVE
Glucose, UA: NEGATIVE
Ketones, UA: NEGATIVE
NITRITE UA: NEGATIVE
PROTEIN UA: NEGATIVE
Urobilinogen, UA: NEGATIVE
pH, UA: 5

## 2015-01-13 NOTE — Telephone Encounter (Signed)
Left voicemail for pt re: labs for today.

## 2015-01-13 NOTE — Patient Instructions (Addendum)

## 2015-01-13 NOTE — Progress Notes (Signed)
34 y.o. G0 Single  African American Fe here for annual exam.  On Depo Provera since 09/25/13.  Some times light spotting or a light menses comes just before a Depo Provera is due.  She is  now engaged and plans wedding next year with pregnancy soon after.   Same partner for 2 years.  No STD concerns.  Just had GC and chlamydia 11/2014 for evaluation of BTB.  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    Patient's last menstrual period was 11/13/2014 (approximate).          Sexually active: Yes.    The current method of family planning is Depo-Provera injections.    Exercising: Yes.    Walking, jogging Smoker:  no  Health Maintenance: Pap: 12/18/13 Neg. HR HPV:neg Self Breast Exam: No TDaP:  2013 Labs: STD Screening  UA: WBC=Small, RBC=Trace   reports that she has never smoked. She has never used smokeless tobacco. She reports that she does not drink alcohol or use illicit drugs.  Past Medical History  Diagnosis Date  . Migraines 2007    per pt none now  . Chlamydia contact, treated 2009    History reviewed. No pertinent past surgical history.  Current Outpatient Prescriptions  Medication Sig Dispense Refill  . medroxyPROGESTERone (DEPO-PROVERA) 150 MG/ML injection Inject 1 mL (150 mg total) into the muscle every 3 (three) months. 1 mL 4   No current facility-administered medications for this visit.    Family History  Problem Relation Age of Onset  . Diabetes Mother   . Hypertension Father   . Hyperlipidemia Father   . Diabetes Maternal Grandmother   . Diabetes Paternal Grandfather     ROS:  Pertinent items are noted in HPI.  Otherwise, a comprehensive ROS was negative.  Exam:   BP 108/70 mmHg  Pulse 80  Resp 16  Ht 5\' 1"  (1.549 m)  Wt 117 lb (53.071 kg)  BMI 22.12 kg/m2  LMP 11/13/2014 (Approximate) Height: 5\' 1"  (154.9 cm) Ht Readings from Last 3 Encounters:  01/13/15 5\' 1"  (1.549 m)  11/16/14 5' 1.5" (1.562 m)  11/10/14 5' 1.5" (1.562 m)     General appearance: alert, cooperative and appears stated age Head: Normocephalic, without obvious abnormality, atraumatic Neck: no adenopathy, supple, symmetrical, trachea midline and thyroid normal to inspection and palpation Lungs: clear to auscultation bilaterally Breasts: normal appearance, no masses or tenderness Heart: regular rate and rhythm Abdomen: soft, non-tender; no masses,  no organomegaly Extremities: extremities normal, atraumatic, no cyanosis or edema Skin: Skin color, texture, turgor normal. No rashes or lesions Lymph nodes: Cervical, supraclavicular, and axillary nodes normal. No abnormal inguinal nodes palpated Neurologic: Grossly normal   Pelvic: External genitalia:  no lesions              Urethra:  normal appearing urethra with no masses, tenderness or lesions              Bartholin's and Skene's: normal                 Vagina: normal appearing vagina with normal color and discharge, no lesions              Cervix: anteverted              Pap taken: No. Bimanual Exam:  Uterus:  normal size, contour, position, consistency, mobility, non-tender              Adnexa: no mass, fullness, tenderness  Rectovaginal: Confirms               Anus:  normal sphincter tone, no lesions  Chaperone present:  No  A:  Well Woman with normal exam  Depo Provera for contraception R/O UTI  Check Rubella status   P:   Reviewed health and wellness pertinent to exam  Pap smear as above  May continue with Depo Provera for another year  Will follow up on rubella titer  Will follow with urine C&S  Counseled on breast self exam, STD prevention, HIV risk factors and prevention, adequate intake of calcium and vitamin D, diet and exercise return annually or prn  An After Visit Summary was printed and given to the patient.

## 2015-01-14 LAB — URINALYSIS, MICROSCOPIC ONLY
Casts: NONE SEEN
Crystals: NONE SEEN
Squamous Epithelial / HPF: NONE SEEN

## 2015-01-14 LAB — RUBELLA SCREEN: RUBELLA: 19 {index} — AB (ref <0.90)

## 2015-01-14 NOTE — Telephone Encounter (Signed)
Pt returned call. States she is opened all day for a call back.

## 2015-01-14 NOTE — Telephone Encounter (Signed)
I did order STD panel from yesterday and order is added. Thank you for calling and clarification of her  Desires for STD testing.  [facility]

## 2015-01-14 NOTE — Telephone Encounter (Addendum)
Patient would like a full STD screening including Gc and chlamydia. We don't have correct specimen for Gc/Chl, she can either come in for dirty urine sample or omit it since she had check 2 months ago. She chose to omit Gc/chl but do all STD check possible with blood specimen.  Wants to do a full check it every year.  Please add orders. Thank you!

## 2015-01-15 LAB — STD PANEL
HEP B S AG: NEGATIVE
HIV: NONREACTIVE

## 2015-01-15 LAB — URINE CULTURE
COLONY COUNT: NO GROWTH
Organism ID, Bacteria: NO GROWTH

## 2015-01-17 NOTE — Progress Notes (Signed)
Encounter reviewed by Dr. Brook Silva.  

## 2015-01-26 ENCOUNTER — Ambulatory Visit (INDEPENDENT_AMBULATORY_CARE_PROVIDER_SITE_OTHER): Payer: BC Managed Care – PPO

## 2015-01-26 VITALS — BP 98/60 | HR 70 | Ht 61.0 in | Wt 117.6 lb

## 2015-01-26 DIAGNOSIS — Z3042 Encounter for surveillance of injectable contraceptive: Secondary | ICD-10-CM

## 2015-01-26 MED ORDER — MEDROXYPROGESTERONE ACETATE 150 MG/ML IM SUSP
150.0000 mg | Freq: Once | INTRAMUSCULAR | Status: AC
  Administered 2015-01-26: 150 mg via INTRAMUSCULAR

## 2015-01-26 NOTE — Progress Notes (Signed)
Depo Provera 150mg  given LUQ.  Patient tolerated well.  Next injection due 04-13-15 to 04-27-15.  AEX was 01-13-15.

## 2015-04-13 ENCOUNTER — Ambulatory Visit (INDEPENDENT_AMBULATORY_CARE_PROVIDER_SITE_OTHER): Payer: BC Managed Care – PPO

## 2015-04-13 VITALS — BP 108/70 | HR 64 | Resp 14 | Wt 116.0 lb

## 2015-04-13 DIAGNOSIS — Z3040 Encounter for surveillance of contraceptives, unspecified: Secondary | ICD-10-CM | POA: Diagnosis not present

## 2015-04-13 MED ORDER — MEDROXYPROGESTERONE ACETATE 150 MG/ML IM SUSP
150.0000 mg | Freq: Once | INTRAMUSCULAR | Status: AC
Start: 2015-04-13 — End: 2015-04-13
  Administered 2015-04-13: 150 mg via INTRAMUSCULAR

## 2015-04-13 NOTE — Progress Notes (Signed)
Patient is within Depo Provera Calender Limits 8/30-9/13/16 Next Depo Due between: 11/15-11/29/16 Last AEX: 01/13/15 with DL AEX Scheduled: 03/14/18 with DL  Patient is aware when next depo is due  Pt tolerated Injection well.  Routed to provider for review, encounter closed.

## 2015-04-29 ENCOUNTER — Ambulatory Visit: Payer: BC Managed Care – PPO

## 2015-06-29 ENCOUNTER — Ambulatory Visit (INDEPENDENT_AMBULATORY_CARE_PROVIDER_SITE_OTHER): Payer: BC Managed Care – PPO | Admitting: *Deleted

## 2015-06-29 VITALS — BP 110/60 | HR 88 | Resp 14 | Wt 119.0 lb

## 2015-06-29 DIAGNOSIS — Z3009 Encounter for other general counseling and advice on contraception: Secondary | ICD-10-CM | POA: Diagnosis not present

## 2015-06-29 MED ORDER — MEDROXYPROGESTERONE ACETATE 150 MG/ML IM SUSP
150.0000 mg | Freq: Once | INTRAMUSCULAR | Status: AC
  Administered 2015-06-29: 150 mg via INTRAMUSCULAR

## 2015-06-29 NOTE — Progress Notes (Signed)
Patient ID: Carmon SailsAlexis Gines, female   DOB: 10/09/1980, 34 y.o.   MRN: [id number] Patient is within Depo Provera Calender Limits. Next Depo Due between: 09/13/14- 09/27/14. Last AEX: 01/13/15 with DL AEX Scheduled: 4/0/986/9/17 with DL  Patient is aware when next depo is due  Pt tolerated Injection well.

## 2015-09-14 ENCOUNTER — Ambulatory Visit (INDEPENDENT_AMBULATORY_CARE_PROVIDER_SITE_OTHER): Payer: BC Managed Care – PPO | Admitting: *Deleted

## 2015-09-14 VITALS — BP 120/68 | HR 80 | Resp 14 | Wt 123.0 lb

## 2015-09-14 DIAGNOSIS — Z3009 Encounter for other general counseling and advice on contraception: Secondary | ICD-10-CM

## 2015-09-14 MED ORDER — MEDROXYPROGESTERONE ACETATE 150 MG/ML IM SUSP
150.0000 mg | Freq: Once | INTRAMUSCULAR | Status: AC
  Administered 2015-09-14: 150 mg via INTRAMUSCULAR

## 2015-09-14 NOTE — Progress Notes (Signed)
Patient ID: Sabrina Richardson, female   DOB: [birth year], 35 y.o.   MRN: [id number] Patient is here for Depo Provera Injection Patient is within Depo Provera Calender Limits 09-14-15-09-28-15 Next Depo Due between: 11-30-15-12-14-15 Last AEX: 01-13-15 AEX Scheduled: 01-21-16  Patient is aware when next depo is due  Pt tolerated Injection well.  Routed to provider for review, encounter closed.

## 2015-11-30 ENCOUNTER — Ambulatory Visit (INDEPENDENT_AMBULATORY_CARE_PROVIDER_SITE_OTHER): Payer: BC Managed Care – PPO | Admitting: *Deleted

## 2015-11-30 VITALS — BP 120/74 | HR 80 | Resp 14 | Wt 127.0 lb

## 2015-11-30 DIAGNOSIS — Z3040 Encounter for surveillance of contraceptives, unspecified: Secondary | ICD-10-CM

## 2015-11-30 MED ORDER — MEDROXYPROGESTERONE ACETATE 150 MG/ML IM SUSP
150.0000 mg | Freq: Once | INTRAMUSCULAR | Status: AC
  Administered 2015-11-30: 150 mg via INTRAMUSCULAR

## 2015-11-30 NOTE — Progress Notes (Signed)
Patient ID: Sabrina Richardson, female   DOB: 09/04/1980, 35 y.o.   MRN: [id number] Patient is here for Depo Provera Injection Patient is within Depo Provera Calender Limits 11/30/15-12/14/15 Next Depo Due between: 02/15/16-02/29/16 Last AEX: 01-13-15 AEX Scheduled: 01-21-16  Patient is aware when next depo is due  Pt tolerated Injection well.  Routed to provider for review, encounter closed.

## 2016-01-21 ENCOUNTER — Encounter: Payer: Self-pay | Admitting: Certified Nurse Midwife

## 2016-01-21 ENCOUNTER — Ambulatory Visit (INDEPENDENT_AMBULATORY_CARE_PROVIDER_SITE_OTHER): Payer: BC Managed Care – PPO | Admitting: Certified Nurse Midwife

## 2016-01-21 VITALS — BP 116/78 | HR 68 | Resp 16 | Ht 61.25 in | Wt 126.0 lb

## 2016-01-21 DIAGNOSIS — Z01419 Encounter for gynecological examination (general) (routine) without abnormal findings: Secondary | ICD-10-CM

## 2016-01-21 DIAGNOSIS — Z124 Encounter for screening for malignant neoplasm of cervix: Secondary | ICD-10-CM | POA: Diagnosis not present

## 2016-01-21 DIAGNOSIS — Z0000 Encounter for general adult medical examination without abnormal findings: Secondary | ICD-10-CM

## 2016-01-21 NOTE — Patient Instructions (Signed)

## 2016-01-21 NOTE — Progress Notes (Signed)
Reviewed personally.  M. Suzanne Poseidon Pam, MD.  

## 2016-01-21 NOTE — Progress Notes (Signed)
35 y.o. G0P0 Single  African American Fe here for annual exam. Periods none with Depo Provera. Tolerating well. Has been monitoring weight and has decreased 5 pounds since last injection. Sees Urgent care if needed. Would like to plan pregnancy later in the year.  No health issues today. Getting married next month!!  No LMP recorded. Patient has had an injection.          Sexually active: Yes.    The current method of family planning is Depo-Provera injections.    Exercising: Yes.    walking, weights & aerobic Smoker:  no  Health Maintenance: Pap:  12-18-13 neg HPV HR neg MMG:  none Colonoscopy:  none BMD:   none TDaP:  2013 Shingles: no Pneumonia: no Hep C and HIV: Hep c neg 2015, HIV neg 2016 Labs: none Self breast exam: done occ   reports that she has never smoked. She has never used smokeless tobacco. She reports that she does not drink alcohol or use illicit drugs.  Past Medical History  Diagnosis Date  . Migraines 2007    per pt none now  . Chlamydia contact, treated 2009    History reviewed. No pertinent past surgical history.  Current Outpatient Prescriptions  Medication Sig Dispense Refill  . medroxyPROGESTERone (DEPO-PROVERA) 150 MG/ML injection Inject 1 mL (150 mg total) into the muscle every 3 (three) months. 1 mL 4   No current facility-administered medications for this visit.    Family History  Problem Relation Age of Onset  . Diabetes Mother   . Hypertension Father   . Hyperlipidemia Father   . Diabetes Maternal Grandmother   . Diabetes Paternal Grandfather     ROS:  Pertinent items are noted in HPI.  Otherwise, a comprehensive ROS was negative.  Exam:   BP 116/78 mmHg  Pulse 68  Resp 16  Ht 5' 1.25" (1.556 m)  Wt 126 lb (57.153 kg)  BMI 23.61 kg/m2 Height: 5' 1.25" (155.6 cm) Ht Readings from Last 3 Encounters:  01/21/16 5' 1.25" (1.556 m)  01/26/15  (1.549 m)  01/13/15  (1.549 m)    General appearance: alert, cooperative and  appears stated age Head: Normocephalic, without obvious abnormality, atraumatic Neck: no adenopathy, supple, symmetrical, trachea midline and thyroid normal to inspection and palpation Lungs: clear to auscultation bilaterally Breasts: normal appearance, no masses or tenderness, No nipple retraction or dimpling, No nipple discharge or bleeding, No axillary or supraclavicular adenopathy Heart: regular rate and rhythm Abdomen: soft, non-tender; no masses,  no organomegaly Extremities: extremities normal, atraumatic, no cyanosis or edema Skin: Skin color, texture, turgor normal. No rashes or lesions Lymph nodes: Cervical, supraclavicular, and axillary nodes normal. No abnormal inguinal nodes palpated Neurologic: Grossly normal   Pelvic: External genitalia:  no lesions              Urethra:  normal appearing urethra with no masses, tenderness or lesions              Bartholin's and Skene's: normal                 Vagina: normal appearing vagina with normal color and discharge, no lesions              Cervix: no cervical motion tenderness, no lesions and nulliparous appearance              Pap taken: Yes.   Bimanual Exam:  Uterus:  normal size, contour, position, consistency, mobility, non-tender and anteverted  Adnexa: normal adnexa and no mass, fullness, tenderness               Rectovaginal: Confirms               Anus:  normal sphincter tone, no lesions  Chaperone present: yes?  A: Normal female annual exam  Contraception Depo Provera desired  May plan pregnancy later in the year  Rubella immune status  P:   Reviewed health and wellness pertinent to exam  Rx Depo Provera 150 mg IM every 3 months x 4, risks and benefits reviewed.  Discussed may take up to one year for first pregnancy and for return of ovulation with Depo Provera use. Patient aware and may try after next dose. [place] Discussed concerns with Zika Virus and to check CDC travelers  update on Zika status. Current recommendation is to wait 6 months for trying for pregnancy if in [place] alert area. Patient will follow recommendations. Discussed concerns with Rubella and non immune status. Will do lab today. Encouraged to schedule preconceptual consult prior to pregnancy. Encouraged to go ahead and start OTC prenatal vitamins. Questions addressed.  Lab Rubella  Pap smear as above taken with HPV reflex   counseled on breast self exam, adequate intake of calcium and vitamin D, diet and exercise return annually or prn  An After Visit Summary was printed and given to the patient.

## 2016-01-24 LAB — RUBELLA SCREEN: RUBELLA: 16.3 {index} — AB (ref <0.90)

## 2016-01-25 LAB — IPS PAP TEST WITH REFLEX TO HPV

## 2016-02-17 ENCOUNTER — Ambulatory Visit (INDEPENDENT_AMBULATORY_CARE_PROVIDER_SITE_OTHER): Payer: BC Managed Care – PPO

## 2016-02-17 VITALS — BP 116/70 | HR 80 | Resp 16 | Wt 125.0 lb

## 2016-02-17 DIAGNOSIS — Z3040 Encounter for surveillance of contraceptives, unspecified: Secondary | ICD-10-CM

## 2016-02-17 MED ORDER — MEDROXYPROGESTERONE ACETATE 150 MG/ML IM SUSP
150.0000 mg | Freq: Once | INTRAMUSCULAR | Status: AC
  Administered 2016-02-17: 150 mg via INTRAMUSCULAR

## 2016-02-17 NOTE — Progress Notes (Signed)
Patient is here for Depo Provera Injection Patient is within Depo Provera Calender Limits 7/4 - 7/18 Next Depo Due between: 9/21 - 10/5 Last AEX: 01/21/16 DL AEX Scheduled: 1/61/096/14/18  Patient is aware when next depo is due  Pt tolerated Injection well.  Routed to provider for review, encounter closed.

## 2016-04-05 ENCOUNTER — Ambulatory Visit: Payer: BC Managed Care – PPO | Admitting: Certified Nurse Midwife

## 2016-04-28 ENCOUNTER — Encounter: Payer: Self-pay | Admitting: Certified Nurse Midwife

## 2016-04-28 ENCOUNTER — Ambulatory Visit (INDEPENDENT_AMBULATORY_CARE_PROVIDER_SITE_OTHER): Payer: BC Managed Care – PPO | Admitting: Certified Nurse Midwife

## 2016-04-28 VITALS — BP 110/68 | HR 68 | Resp 16 | Ht 61.25 in | Wt 126.0 lb

## 2016-04-28 DIAGNOSIS — Z3169 Encounter for other general counseling and advice on procreation: Secondary | ICD-10-CM

## 2016-04-28 NOTE — Patient Instructions (Signed)
Prenatal Care °WHAT IS PRENATAL CARE?  °Prenatal care is the process of caring for a pregnant woman before she gives birth. Prenatal care makes sure that she and her baby remain as healthy as possible throughout pregnancy. Prenatal care may be provided by a midwife, family practice health care provider, or a childbirth and pregnancy specialist (obstetrician). Prenatal care may include physical examinations, testing, treatments, and education on nutrition, lifestyle, and social support services. °WHY IS PRENATAL CARE SO IMPORTANT?  °Early and consistent prenatal care increases the chance that you and your baby will remain healthy throughout your pregnancy. This type of care also decreases a baby's risk of being born too early (prematurely), or being born smaller than expected (small for gestational age). Any underlying medical conditions you may have that could pose a risk during your pregnancy are discussed during prenatal care visits. You will also be monitored regularly for any new conditions that may arise during your pregnancy so they can be treated quickly and effectively. °WHAT HAPPENS DURING PRENATAL CARE VISITS? °Prenatal care visits may include the following: °Discussion °Tell your health care provider about any new signs or symptoms you have experienced since your last visit. These might include: °· Nausea or vomiting. °· Increased or decreased level of energy. °· Difficulty sleeping. °· Back or leg pain. °· Weight changes. °· Frequent urination. °· Shortness of breath with physical activity. °· Changes in your skin, such as the development of a rash or itchiness. °· Vaginal discharge or bleeding. °· Feelings of excitement or nervousness. °· Changes in your baby's movements. °You may want to write down any questions or topics you want to discuss with your health care provider and bring them with you to your appointment. °Examination °During your first prenatal care visit, you will likely have a complete  physical exam. Your health care provider will often examine your vagina, cervix, and the position of your uterus, as well as check your heart, lungs, and other body systems. As your pregnancy progresses, your health care provider will measure the size of your uterus and your baby's position inside your uterus. He or she may also examine you for early signs of labor. Your prenatal visits may also include checking your blood pressure and, after about 10-12 weeks of pregnancy, listening to your baby's heartbeat. °Testing °Regular testing often includes: °· Urinalysis. This checks your urine for glucose, protein, or signs of infection. °· Blood count. This checks the levels of white and red blood cells in your body. °· Tests for sexually transmitted infections (STIs). Testing for STIs at the beginning of pregnancy is routinely done and is required in many states. °· Antibody testing. You will be checked to see if you are immune to certain illnesses, such as rubella, that can affect a developing fetus. °· Glucose screen. Around 24-28 weeks of pregnancy, your blood glucose level will be checked for signs of gestational diabetes. Follow-up tests may be recommended. °· Group B strep. This is a bacteria that is commonly found inside a woman's vagina. This test will inform your health care provider if you need an antibiotic to reduce the amount of this bacteria in your body prior to labor and childbirth. °· Ultrasound. Many pregnant women undergo an ultrasound screening around 18-20 weeks of pregnancy to evaluate the health of the fetus and check for any developmental abnormalities. °· HIV (human immunodeficiency virus) testing. Early in your pregnancy, you will be screened for HIV. If you are at high risk for HIV, this test   may be repeated during your third trimester of pregnancy. °You may be offered other testing based on your age, personal or family medical history, or other factors.  °HOW OFTEN SHOULD I PLAN TO SEE MY  HEALTH CARE PROVIDER FOR PRENATAL CARE? °Your prenatal care check-up schedule depends on any medical conditions you have before, or develop during, your pregnancy. If you do not have any underlying medical conditions, you will likely be seen for checkups: °· Monthly, during the first 6 months of pregnancy. °· Twice a month during months 7 and 8 of pregnancy. °· Weekly starting in the 9th month of pregnancy and until delivery. °If you develop signs of early labor or other concerning signs or symptoms, you may need to see your health care provider more often. Ask your health care provider what prenatal care schedule is best for you. °WHAT CAN I DO TO KEEP MYSELF AND MY BABY AS HEALTHY AS POSSIBLE DURING MY PREGNANCY? °· Take a prenatal vitamin containing 400 micrograms (0.4 mg) of folic acid every day. Your health care provider may also ask you to take additional vitamins such as iodine, vitamin D, iron, copper, and zinc. °· Take 1500-2000 mg of calcium daily starting at your 20th week of pregnancy until you deliver your baby. °· Make sure you are up to date on your vaccinations. Unless directed otherwise by your health care provider: °¨ You should receive a tetanus, diphtheria, and pertussis (Tdap) vaccination between the 27th and 36th week of your pregnancy, regardless of when your last Tdap immunization occurred. This helps protect your baby from whooping cough (pertussis) after he or she is born. °¨ You should receive an annual inactivated influenza vaccine (IIV) to help protect you and your baby from influenza. This can be done at any point during your pregnancy. °· Eat a well-rounded diet that includes: °¨ Fresh fruits and vegetables. °¨ Lean proteins. °¨ Calcium-rich foods such as milk, yogurt, hard cheeses, and dark, leafy greens. °¨ Whole grain breads. °· Do not eat seafood high in mercury, including: °¨ Swordfish. °¨ Tilefish. °¨ Shark. °¨ King mackerel. °¨ More than 6 oz tuna per week. °· Do not eat: °¨ Raw  or undercooked meats or eggs. °¨ Unpasteurized foods, such as soft cheeses (brie, blue, or feta), juices, and milks. °¨ Lunch meats. °¨ Hot dogs that have not been heated until they are steaming. °· Drink enough water to keep your urine clear or pale yellow. For many women, this may be 10 or more 8 oz glasses of water each day. Keeping yourself hydrated helps deliver nutrients to your baby and may prevent the start of pre-term uterine contractions. °· Do not use any tobacco products including cigarettes, chewing tobacco, or electronic cigarettes. If you need help quitting, ask your health care provider. °· Do not drink beverages containing alcohol. No safe level of alcohol consumption during pregnancy has been determined. °· Do not use any illegal drugs. These can harm your developing baby or cause a miscarriage. °· Ask your health care provider or pharmacist before taking any prescription or over-the-counter medicines, herbs, or supplements. °· Limit your caffeine intake to no more than 200 mg per day. °· Exercise. Unless told otherwise by your health care provider, try to get 30 minutes of moderate exercise most days of the week. Do not  do high-impact activities, contact sports, or activities with a high risk of falling, such as horseback riding or downhill skiing. °· Get plenty of rest. °· Avoid anything that raises your   body temperature, such as hot tubs and saunas. °· If you own a cat, do not empty its litter box. Bacteria contained in cat feces can cause an infection called toxoplasmosis. This can result in serious harm to the fetus. °· Stay away from chemicals such as insecticides, lead, mercury, and cleaning or paint products that contain solvents. °· Do not have any X-rays taken unless medically necessary. °· Take a childbirth and breastfeeding preparation class. Ask your health care provider if you need a referral or recommendation. °  °This information is not intended to replace advice given to you by  your health care provider. Make sure you discuss any questions you have with your health care provider. °  °Document Released: 08/03/2003 Document Revised: 08/21/2014 Document Reviewed: 10/15/2013 °Elsevier Interactive Patient Education ©2016 [employer] ° °

## 2016-04-28 NOTE — Progress Notes (Addendum)
35 y.o.Married African American g0p0  Female here with spouse for preconceptual counseling. Recently married this summer and would like to plan for pregnancy in the next 6 months. No health issues today.  Gynecological History:    Menarche:age 35 LMP::not sure not in last year due to Depo use Length of Menses: in past 28-30 days GYN infectious disease history:  (Abnormal pap, venereal warts, herpes, or other STD's )No Current Birth Control method:Depo Provera Last time birth control was used: currently still effective for one more month and plans not to renew  PMH:  Any history of DM, HTN, epilepsy, Heart Murmur, or thyroid problems? No   If so, when did it begin? Are you or have you ever been anemic?No If so for how long? Have you ever had any accidents? No What type? Do you have any allergies? No Do you take any sedatives or tranquilizers? No Any domestic violence? No Any medications? No   (such as medicationss for acne - certain medications can cause birth defects and Ace inhibitors can cause kidney problems in the fetus.)  Patient's Past Medical History:  Have you ever had surgery related to female organs? No Past pregnancies/ complications/ or miscarriages/ abortions? No ETOH? No Tobacco Use?No Drug use? No  Reviewed Medication list: No Current job exposure risk - toxins/ Lead/ Mercury No Hot tub/ sauna use? No Do you commonly run long distance or do strenuous exercise? No Do you eat a strict vegetarian diet? No  Partners Past Medical History:  Have you ever had surgery related to female organs? No Previous Paternity? Yes Testicular Injury? No ETOH No Tobacco use: No Drug use No Current medications: None Current job exposure risk toxins/ lead/ mercury No Hot/ tub sauna use? No Do you commonly run long distance or do strenuous exercise? Yes  Self defense training and work out Patient unable to give blood due to living in [place] and possible exposure to mad cow disease  only, has never had disease   Patient's Family Medical history: Yes Partners Family Medical History: Yes  Ethnic background? Meditterranean/ Asian/Chinese/ Ashkenazi Jews / [place] Dutch /[place] / [place] French - [place], [place] background   Patients FMH:      Partners FMH: Multiple Births No   Multiple Births No Genetic Disorders No   Genetic Disorders No  Sickle cell      Sickle Cell  Hemophilia      Hemophila  Cystic Fibrosis     Cystic Fibrosis  Mental retardation     Mental retardation  Downs Syndrome     Downs Syndrome  Immunization Updates: Rubella Vaccine/ titer Immune status Chicken Pox / vaccination Yes Toxoplasmosis exposure (no changing litter box) Yes Tdap for pt. and partner Yes Hepatitis B (if at high risk) Yes Influenza vaccine who may get pregnant during the flu season Yes  Recommendations:   No ETOH / Tobacco / Drugs  Limit Caffeine  No Artificial Sweeteners  No raw beef  Restrict High fat foods  Limit servings of large fish (e.g., swordfish) to 1 X month  Foods associated with Listeria transmission ( e.g., sliced delicatessen meats &  Cheese) If eating deli meats should microwave at least 5 seconds.  No hot / tub Saunas  OTC med list reviewed  Counseling: Discussed with patient that period may take up to 6 months to reoccur after Depo Provera, but needs to advise if no period after when Depo is due for 3 months. Advised to start prenatal vitamins  now due to folic acid need . Patient aware. Given BBT graph to assess cycles and ovulation once period returns. Questions addressed. Discussed due to age of 89 she should advise if not pregnancy by 6 months and can start infertility evaluation. Aware of AMA and concerns with sometimes more difficult to conceive. Patient and spouse verbalized understanding and will advise.  Normal preganancy rates 80 % within 1 year  Discussion of timing of intercourse to ovulation  Call and come in  with positive pregnancy test or no periods as discussed.  Time spent with patient / significant other: 35 minutes in discussion of pregnancy planning.

## 2016-04-30 NOTE — Progress Notes (Addendum)
In note.

## 2016-04-30 NOTE — Progress Notes (Signed)
Encounter reviewed Jayln Madeira, MD   

## 2016-05-04 ENCOUNTER — Ambulatory Visit: Payer: BC Managed Care – PPO

## 2017-01-21 ENCOUNTER — Encounter: Payer: Self-pay | Admitting: Certified Nurse Midwife

## 2017-01-22 ENCOUNTER — Telehealth: Payer: Self-pay

## 2017-01-22 NOTE — Telephone Encounter (Signed)
Telephone call created to review with Ria CommentPatricia Grubb, FNP as Leota Sauerseborah Leonard CNM is out of the office.

## 2017-01-22 NOTE — Telephone Encounter (Signed)
Debbie please advise.

## 2017-01-22 NOTE — Telephone Encounter (Signed)
Routing to Ria CommentPatricia Grubb, FNP for review and advise.  Non-Urgent Medical Question  Message [phone]  From Sabrina SailsAlexis Richardson To Verner CholDeborah S Leonard, CNM Sent 01/21/2017 10:09 AM  Hi,  I have an annual check-up Thursday, and I was wondering if there are any tests/screenings I could take that day regarding fertility. My last depo was June 2017 and during my preconception meeting last year I was encouraged to check-in in 6 months (or maybe a year).  Sincerely,  Sabrina SailsAlexis Richardson   Responsible Party   Pool - [facility] Pool No one has taken responsibility for this message.  No actions have been taken on this message.

## 2017-01-23 NOTE — Telephone Encounter (Signed)
Spoke with patient. Advised of results as seen below from Deborah Leonard CNM. Patient is agreeable and verbalizes understanding.  Routing to provider for final review. Patient agreeable to disposition. Will close encounter.  

## 2017-01-23 NOTE — Telephone Encounter (Signed)
We can do some screening labs depending on what she presents at aex. Also will discuss with patient at that time. Needs to bring her menses calendar if she has been recording periods.

## 2017-01-23 NOTE — Telephone Encounter (Signed)
Left message to call Kaitlyn at [phone]. 

## 2017-01-24 NOTE — Progress Notes (Signed)
36 y.o. G0P0 Married  African American Fe here for annual exam. Last Depo Provera was  02/27/16. Has not established normal period pattern. Has had ? Spotting in 1/18 and 09/14/16 was more like a very light period for days. No spotting since then. Still having some discomfort with sexual activity, using lubricant and coconut oil. Sees Dr. Kateri PlummerMorrow PCP yearly with labs.   Patient's last menstrual period was 09/14/2016 (exact date).          Sexually active: Yes.    The current method of family planning is none.    Exercising: Yes.    walking, cardio & running Smoker:  no  Health Maintenance: Pap:  12-18-13 neg HPV HR neg, 01-21-16 neg History of Abnormal Pap: no MMG:  none Self Breast exams: occ Colonoscopy:  none BMD:   none TDaP:  2013 Shingles: no Pneumonia: no Hep C and HIV: Hep c neg 2015, HIV neg 2016 Labs: poct upt-neg   reports that she has never smoked. She has never used smokeless tobacco. She reports that she does not drink alcohol or use drugs.  Past Medical History:  Diagnosis Date  . Chlamydia contact, treated 2009  . Migraines 2007   per pt none now    History reviewed. No pertinent surgical history.  Current Outpatient Prescriptions  Medication Sig Dispense Refill  . Prenatal Vit-Fe Fumarate-FA (PRENATAL VITAMIN PO) Take by mouth daily.     No current facility-administered medications for this visit.     Family History  Problem Relation Age of Onset  . Diabetes Mother   . Hypertension Father   . Hyperlipidemia Father   . Diabetes Maternal Grandmother   . Diabetes Paternal Grandfather     ROS:  Pertinent items are noted in HPI.  Otherwise, a comprehensive ROS was negative.  Exam:   BP 94/60   Pulse 60   Resp 16   Ht 5' 1.75" (1.568 m)   Wt 128 lb (58.1 kg)   LMP 09/14/2016 (Exact Date)   BMI 23.60 kg/m  Height: 5' 1.75" (156.8 cm) Ht Readings from Last 3 Encounters:  01/25/17 5' 1.75" (1.568 m)  04/28/16 5' 1.25" (1.556 m)  01/21/16 5' 1.25" (1.556  m)    General appearance: alert, cooperative and appears stated age Head: Normocephalic, without obvious abnormality, atraumatic Neck: no adenopathy, supple, symmetrical, trachea midline and thyroid normal to inspection and palpation Lungs: clear to auscultation bilaterally Breasts: normal appearance, no masses or tenderness, No nipple retraction or dimpling, No nipple discharge or bleeding, No axillary or supraclavicular adenopathy Heart: regular rate and rhythm Abdomen: soft, non-tender; no masses,  no organomegaly Extremities: extremities normal, atraumatic, no cyanosis or edema Skin: Skin color, texture, turgor normal. No rashes or lesions Lymph nodes: Cervical, supraclavicular, and axillary nodes normal. No abnormal inguinal nodes palpated Neurologic: Grossly normal   Pelvic: External genitalia:  no lesions              Urethra:  normal appearing urethra with no masses, tenderness or lesions              Bartholin's and Skene's: normal                 Vagina: normal appearing vagina with normal color and discharge, no lesions              Cervix: no cervical motion tenderness, no lesions and nulliparous appearance              Pap taken: No.  Bimanual Exam:  Uterus:  normal size, contour, position, consistency, mobility, non-tender and anteverted              Adnexa: normal adnexa and no mass, fullness, tenderness               Rectovaginal: Confirms               Anus:  normal sphincter tone, no lesions  Chaperone present: yes  A:  Well Woman with normal exam  Amenorrhea with previous Depo Provera use with  One? Period since 10/17( when depo would have been due again. UPT negative.  Contraception none, trying for pregnancy on prenatal vitamins  Recent extended family history colon cancer (cousins)  P:   Reviewed health and wellness pertinent to exam  Discussed evaluation for amenorrhea even though may take to a year to resume normal cycle. Patient agreeable. Also discussed  possible OCP use to establish normal hormonal balance if needed. Patient not sure she would want to that at present. Will continue to monitor spotting episode to see if regular as if cycle and advise in 3 months.  Lab:TSH, Prolactin, FSH  Discussed recommended screening for colon cancer is age 50 and depends on relative age when diagnosed as to recommendation to family. Patient will inquire more about the family status and advise.  Pap smear: no   counseled on breast self exam, adequate intake of calcium and vitamin D, diet and exercise  return annually or prn  An After Visit Summary was printed and given to the patient.

## 2017-01-25 ENCOUNTER — Encounter: Payer: Self-pay | Admitting: Certified Nurse Midwife

## 2017-01-25 ENCOUNTER — Ambulatory Visit (INDEPENDENT_AMBULATORY_CARE_PROVIDER_SITE_OTHER): Payer: BC Managed Care – PPO | Admitting: Certified Nurse Midwife

## 2017-01-25 VITALS — BP 94/60 | HR 60 | Resp 16 | Ht 61.75 in | Wt 128.0 lb

## 2017-01-25 DIAGNOSIS — Z01419 Encounter for gynecological examination (general) (routine) without abnormal findings: Secondary | ICD-10-CM | POA: Diagnosis not present

## 2017-01-25 DIAGNOSIS — N912 Amenorrhea, unspecified: Secondary | ICD-10-CM

## 2017-01-25 LAB — POCT URINE PREGNANCY: Preg Test, Ur: NEGATIVE

## 2017-01-25 NOTE — Patient Instructions (Signed)

## 2017-01-26 ENCOUNTER — Other Ambulatory Visit: Payer: Self-pay | Admitting: Certified Nurse Midwife

## 2017-01-26 DIAGNOSIS — N912 Amenorrhea, unspecified: Secondary | ICD-10-CM

## 2017-01-26 LAB — FOLLICLE STIMULATING HORMONE: FSH: 8.6 m[IU]/mL

## 2017-01-26 LAB — PROLACTIN: PROLACTIN: 14.9 ng/mL (ref 4.8–23.3)

## 2017-01-26 LAB — TSH: TSH: 1.58 u[IU]/mL (ref 0.450–4.500)

## 2017-02-09 ENCOUNTER — Other Ambulatory Visit (INDEPENDENT_AMBULATORY_CARE_PROVIDER_SITE_OTHER): Payer: BC Managed Care – PPO

## 2017-02-09 DIAGNOSIS — N912 Amenorrhea, unspecified: Secondary | ICD-10-CM

## 2017-02-10 LAB — HCG, SERUM, QUALITATIVE: HCG, BETA SUBUNIT, QUAL, SERUM: NEGATIVE m[IU]/mL (ref <6)

## 2017-02-12 ENCOUNTER — Telehealth: Payer: Self-pay

## 2017-02-12 ENCOUNTER — Other Ambulatory Visit: Payer: Self-pay

## 2017-02-12 MED ORDER — MEDROXYPROGESTERONE ACETATE 5 MG PO TABS: 5.0000 mg | ORAL_TABLET | Freq: Every day | ORAL | 0 refills | Status: DC

## 2017-02-12 NOTE — Telephone Encounter (Signed)
-----   Message from Verner Choleborah S Leonard, CNM sent at 02/12/2017  9:15 AM EDT ----- Notify patient that serum HCG is negative would like her to do Provera challenge to stimulate withdrawal bleeding as we discussed. Rx Provera 5 mg x 10 days continue consistent condom use.  Needs to advise if has bleeding or no bleeding up to 2 weeks after last dose.  Order not placed

## 2017-02-12 NOTE — Telephone Encounter (Signed)
Patient notified of results. See lab 

## 2017-02-12 NOTE — Telephone Encounter (Signed)
lmtcb

## 2017-03-05 ENCOUNTER — Encounter: Payer: Self-pay | Admitting: Certified Nurse Midwife

## 2017-12-20 ENCOUNTER — Telehealth: Payer: Self-pay | Admitting: Certified Nurse Midwife

## 2017-12-20 NOTE — Telephone Encounter (Signed)
Left message on voicemail to call and reschedule cancelled appointment. °

## 2018-01-29 ENCOUNTER — Ambulatory Visit: Payer: BC Managed Care – PPO | Admitting: Certified Nurse Midwife

## 2018-02-15 ENCOUNTER — Other Ambulatory Visit (HOSPITAL_COMMUNITY)
Admission: RE | Admit: 2018-02-15 | Discharge: 2018-02-15 | Disposition: A | Discharge status: Home / Self Care | Payer: BC Managed Care – PPO | Source: Ambulatory Visit | Attending: Obstetrics & Gynecology | Admitting: Obstetrics & Gynecology

## 2018-02-15 ENCOUNTER — Ambulatory Visit: Payer: BC Managed Care – PPO | Admitting: Certified Nurse Midwife

## 2018-02-15 ENCOUNTER — Encounter: Payer: Self-pay | Admitting: Certified Nurse Midwife

## 2018-02-15 ENCOUNTER — Other Ambulatory Visit: Payer: Self-pay

## 2018-02-15 VITALS — BP 110/78 | HR 68 | Resp 16 | Ht 61.0 in | Wt 127.0 lb

## 2018-02-15 DIAGNOSIS — Z01419 Encounter for gynecological examination (general) (routine) without abnormal findings: Secondary | ICD-10-CM

## 2018-02-15 DIAGNOSIS — Z789 Other specified health status: Secondary | ICD-10-CM | POA: Diagnosis not present

## 2018-02-15 DIAGNOSIS — Z0000 Encounter for general adult medical examination without abnormal findings: Secondary | ICD-10-CM

## 2018-02-15 DIAGNOSIS — Z124 Encounter for screening for malignant neoplasm of cervix: Secondary | ICD-10-CM | POA: Diagnosis not present

## 2018-02-15 NOTE — Patient Instructions (Signed)

## 2018-02-15 NOTE — Progress Notes (Signed)
37 y.o. G0P0 Married  African American Fe here for annual exam. Periods normal with late onset today after long road trip. Contraception none, hopeful for pregnancy. Spouse may consider sperm count. Patient has used ovulation kit and noted ovulation. Denies any health issues today, but would like screening labs if needed.   Patient's last menstrual period was 02/14/2018 (exact date).          Sexually active: Yes.    The current method of family planning is none.    Exercising: Yes.    walking & cardio Smoker:  no  Health Maintenance: Pap:  01-21-16 neg History of Abnormal Pap: no MMG:  none Self Breast exams: no Colonoscopy:  none BMD:   none TDaP:  2013 Shingles: no Pneumonia: no Hep C and HIV: Hep c neg 2015, HIV neg 2016 Labs: yes   reports that she has never smoked. She has never used smokeless tobacco. She reports that she does not drink alcohol or use drugs.  Past Medical History:  Diagnosis Date  . Chlamydia contact, treated 2009  . Migraines 2007   per pt none now    No past surgical history on file.  Current Outpatient Medications  Medication Sig Dispense Refill  . medroxyPROGESTERone (PROVERA) 5 MG tablet Take 1 tablet (5 mg total) by mouth daily. 10 tablet 0  . Prenatal Vit-Fe Fumarate-FA (PRENATAL VITAMIN PO) Take by mouth daily.     No current facility-administered medications for this visit.     Family History  Problem Relation Age of Onset  . Diabetes Mother   . Hypertension Father   . Hyperlipidemia Father   . Diabetes Maternal Grandmother   . Diabetes Paternal Grandfather     ROS:  Pertinent items are noted in HPI.  Otherwise, a comprehensive ROS was negative.  Exam:   BP 110/78   Pulse 68   Resp 16   Ht '5\' 1"'  (1.549 m)   Wt 127 lb (57.6 kg)   LMP 02/14/2018 (Exact Date)   BMI 24.00 kg/m  Height: '5\' 1"'  (154.9 cm) Ht Readings from Last 3 Encounters:  02/15/18 '5\' 1"'  (1.549 m)  01/25/17 5' 1.75" (1.568 m)  04/28/16 5' 1.25" (1.556 m)     General appearance: alert, cooperative and appears stated age Head: Normocephalic, without obvious abnormality, atraumatic Neck: no adenopathy, supple, symmetrical, trachea midline and thyroid normal to inspection and palpation Lungs: clear to auscultation bilaterally Breasts: normal appearance, no masses or tenderness, No nipple retraction or dimpling, No nipple discharge or bleeding, No axillary or supraclavicular adenopathy Heart: regular rate and rhythm Abdomen: soft, non-tender; no masses,  no organomegaly Extremities: extremities normal, atraumatic, no cyanosis or edema Skin: Skin color, texture, turgor normal. No rashes or lesions Lymph nodes: Cervical, supraclavicular, and axillary nodes normal. No abnormal inguinal nodes palpated Neurologic: Grossly normal   Pelvic: External genitalia:  no lesions, normal female              Urethra:  normal appearing urethra with no masses, tenderness or lesions              Bartholin's and Skene's: normal                 Vagina: normal appearing vagina with normal color and menses discharge, no lesions              Cervix: no cervical motion tenderness, no lesions and nulliparous appearance  Pap taken: Yes.   Bimanual Exam:  Uterus:  normal size, contour, position, consistency, mobility, non-tender and anteverted              Adnexa: normal adnexa and no mass, fullness, tenderness               Rectovaginal: Confirms               Anus:  normal sphincter tone, no lesions  Chaperone present: yes  A:  Well Woman with normal exam  Contraception none,  Hopeful for pregnancy, on prenatal vitamins  Rubella status( checked 2 years ago)  Screening labs P:   Reviewed health and wellness pertinent to exam  Patient will advise if spouse desires sperm assessment and will call so we can help her with this. Discussed further evaluation here with consult with MD regarding regarding infertility and work up here. May need PUS to evaluate  tubes due to history of chlamydia in past. Patient will consider and advise. Continue prenatal vitamins daily.  Lab: Rubella, Lipid panel, TSH  Pap smear: yes   counseled on breast self exam, adequate intake of calcium and vitamin D, diet and exercise return annually or prn  An After Visit Summary was printed and given to the patient.

## 2018-02-16 LAB — TSH: TSH: 1.89 u[IU]/mL (ref 0.450–4.500)

## 2018-02-16 LAB — LIPID PANEL
CHOL/HDL RATIO: 3.1 ratio (ref 0.0–4.4)
Cholesterol, Total: 160 mg/dL (ref 100–199)
HDL: 52 mg/dL (ref >39)
LDL Calculated: 95 mg/dL (ref 0–99)
TRIGLYCERIDES: 63 mg/dL (ref 0–149)
VLDL Cholesterol Cal: 13 mg/dL (ref 5–40)

## 2018-02-16 LAB — RUBELLA SCREEN: Rubella Antibodies, IGG: 23.3 index (ref >0.99)

## 2018-02-19 LAB — CYTOLOGY - PAP
DIAGNOSIS: NEGATIVE
HPV (WINDOPATH): NOT DETECTED

## 2018-02-20 ENCOUNTER — Telehealth: Payer: Self-pay | Admitting: Certified Nurse Midwife

## 2018-02-20 NOTE — Telephone Encounter (Signed)
Order and referral faxed, semens analysis kit placed up at front office.   Encounter closed.

## 2018-02-20 NOTE — Telephone Encounter (Signed)
Sabrina Richardson, CNM -ok to proceed with semen analysis or MD consult first?

## 2018-02-20 NOTE — Telephone Encounter (Signed)
Spoke with patient, advised as seen below per Melvia Heaps, CNM. Advised patient order will be faxed to [facility], will need to pick up semens analysis kit from [facility]. Instruction will be included in kit for scheduling, drop off and collection. Partner information provided. Patient verbalizes understanding and is agreeable.    Written order to Melvia Heaps, CNM for signature.

## 2018-02-20 NOTE — Telephone Encounter (Signed)
Patient would like to come in to pick up a semen analysis kit.

## 2018-02-20 NOTE — Telephone Encounter (Signed)
Ok to proceed with semen analysis, per conversation with patient at visit and then consult if she desires. She will advise.

## 2018-03-14 ENCOUNTER — Telehealth: Payer: Self-pay

## 2018-03-14 NOTE — Telephone Encounter (Signed)
Pt was called & given results that her husbands sperm count was normal. Pt scheduled consult appt with dr Hyacinth Meekermiller on 04-05-18 at 8:30am

## 2018-04-02 ENCOUNTER — Encounter: Payer: Self-pay | Admitting: Obstetrics & Gynecology

## 2018-04-03 ENCOUNTER — Telehealth: Payer: Self-pay | Admitting: Obstetrics & Gynecology

## 2018-04-03 NOTE — Telephone Encounter (Signed)
VOID

## 2018-04-05 ENCOUNTER — Ambulatory Visit: Payer: BC Managed Care – PPO | Admitting: Obstetrics & Gynecology

## 2018-04-05 ENCOUNTER — Encounter: Payer: Self-pay | Admitting: Obstetrics & Gynecology

## 2018-04-05 ENCOUNTER — Other Ambulatory Visit: Payer: Self-pay

## 2018-04-05 VITALS — BP 110/80 | HR 76 | Resp 14 | Ht 61.0 in | Wt 127.4 lb

## 2018-04-05 DIAGNOSIS — N979 Female infertility, unspecified: Secondary | ICD-10-CM

## 2018-04-05 NOTE — Progress Notes (Signed)
37 yrs [place] Married female G0P0 here for discussion of desires to be pregnant.  Married 2 years.  She's been off contraceptive since she got married.  She's not been keeping track of cycles.  App has told her cycles are 35.  She has done some ovulation predictor kids. LMP:  03/20/18.  OPK was positive on 04/02/18.  She has been keeping track of intercourse as well.  Husband has negative semen analysis.    PMed hx:  Chlamydia 2009.  Treated. PSurg Hx: negative Smoker: No  Spouse PMed Hx: negative Spouse PSurg hx:  negative Spouse smokes: No  D/w pt components of fertility evaluation including testing for ovulation, semen analysis testing, and evaluation of cavity of uterus and for tubal patency.  Speed of this evaluation and proceeding with fertility medication will depend on desires and pt and her spouse.  Also, due to age, feel should test for ovarian reserve as well.  All of this was written down for pt and she clearly understands the plan.  Questions invited and answered.  Specific instructions regarding each part of evaluation were given to patient.  Assessment:  Primary infertility  Plan:   1.  AMH testing planned.  2.  Day 21 progesterone level will be obtained on 04/09/18.  Lab appt for both will be scheduled for this.  3.  Continue using OPK with timed intercourse.  4.  Plan SHGM with next cycle.  Pt understands this is done timed with menstrual cycle days 1-9.  6. Continue PNV.    ~25 minutes spent with patient >50% of time was in face to face discussion of above.

## 2018-04-09 ENCOUNTER — Other Ambulatory Visit (INDEPENDENT_AMBULATORY_CARE_PROVIDER_SITE_OTHER): Payer: BC Managed Care – PPO

## 2018-04-09 DIAGNOSIS — N979 Female infertility, unspecified: Secondary | ICD-10-CM

## 2018-04-13 LAB — PROGESTERONE: PROGESTERONE: 0.4 ng/mL

## 2018-04-13 LAB — ANTI MULLERIAN HORMONE: ANTI-MULLERIAN HORMONE (AMH): 23.9 ng/mL — AB

## 2018-04-18 ENCOUNTER — Telehealth: Payer: Self-pay | Admitting: Obstetrics & Gynecology

## 2018-04-18 ENCOUNTER — Other Ambulatory Visit: Payer: Self-pay | Admitting: Obstetrics & Gynecology

## 2018-04-18 ENCOUNTER — Encounter: Payer: Self-pay | Admitting: Obstetrics & Gynecology

## 2018-04-18 DIAGNOSIS — N979 Female infertility, unspecified: Secondary | ICD-10-CM

## 2018-04-18 NOTE — Telephone Encounter (Signed)
Patient sent the following correspondence through MyChart. Routing to triage to assist patient with request.  Good Afternoon,   I have received my two blood test results in MyChart and was hoping you could help me interpret them?  I read them as the Progesterone test was ok, but the [facility] was too high? I wasn't sure, of course, so I just wanted to double-check.   Thank you for help.   Sincerely,   Sabrina Richardson

## 2018-04-18 NOTE — Telephone Encounter (Signed)
Dr. Hyacinth Meeker -please review progesterone and AMH results dated 8/27 and advise.

## 2018-04-19 NOTE — Telephone Encounter (Signed)
Spoke with patient, advised as seen below per Dr. Hyacinth Meeker. LMP 03/20/18. Patient aware to return call to office to schedule [facility] with next menses, days 1-9. Order placed for precert. Patient verbalizes understanding and is agreeable. Encounter closed.   Routing to [employer] D. For precert.

## 2018-04-19 NOTE — Telephone Encounter (Signed)
An elevated AMH is often indicative of PCOS.  Her progesterone level did not indicated ovulation.  I need to know when she starts her next cycle to see if the progesterone was done on the correct day.  Also, I would advise proceeding with the ultrasound after her next cycle.  PCOS is also often seen on ultrasound.  If this is the case with her ultrasound, we will discuss use of ovulation induction medications to help her ovulate more regularly and predictably.

## 2018-04-23 ENCOUNTER — Telehealth: Payer: Self-pay | Admitting: Obstetrics & Gynecology

## 2018-04-23 NOTE — Telephone Encounter (Signed)
Call placed to patient to follow up in regards to scheduling sonohysterogram, Patient advises her last cycle was on 03/20/18 and states she thought she would have started her cycle buy now. Patient understands to call at the onset of her cycle to scheduled.   Forwarding to Triage Nurse, to review, since patient has not started her cycle.

## 2018-04-23 NOTE — Telephone Encounter (Signed)
Call to patient. She is unsure if she has tested for pregnancy but will check when she gets home.  Advised patient to call with her first day of her period or call back if no cycle in 1 week and negative pregnancy test or with positive pregnancy testing. Pt verbalized understanding and will call back with any concerns.  To Dr. Hyacinth Meeker to review prior to closing encounter to ensure no additional or different instructions.

## 2018-04-23 NOTE — Telephone Encounter (Signed)
Reviewed notes from Dr. Hyacinth Meeker. Per patient records she typically has a 35 day cycle. Today would be cycle day 35.   Pt aware to call with first day of cycle to schedule Sonohysterogram.

## 2018-04-23 NOTE — Telephone Encounter (Signed)
Agree with recommendations.  Thanks.  Ok to close encounter. 

## 2018-04-30 ENCOUNTER — Telehealth: Payer: Self-pay | Admitting: Certified Nurse Midwife

## 2018-04-30 MED ORDER — LETROZOLE 2.5 MG PO TABS: 7.5000 mg | ORAL_TABLET | Freq: Every day | ORAL | 0 refills | Status: DC

## 2018-04-30 MED ORDER — DOXYCYCLINE HYCLATE 100 MG PO CAPS: 100.0000 mg | ORAL_CAPSULE | Freq: Two times a day (BID) | ORAL | 0 refills | Status: AC

## 2018-04-30 NOTE — Telephone Encounter (Signed)
Spoke with patient. Started cycle today. 04/30/18.  Was to call to schedule Sonohysterogram with cycle.  Advised will review with provider and return call with plan.

## 2018-04-30 NOTE — Telephone Encounter (Signed)
Reviewed with Dr. Hyacinth MeekerMiller.   Okay for Sonohysterogram with Dr. Hyacinth MeekerMiller on 05/09/18. Cycle day 10. Abstain.  Doxycycline 100 mg BID x 3 days starting the day of Sonohysterogram.  Femara 7.5 mg po on cycle days 5-9.   Return call to patient and is agreeable to plan.  Sonohysterogram scheduled for 05/09/18 and she is aware to abstain from now until 05/09/18 appointment. Doxycycline as directed starting the morning of her appointment for Sonohysterogram. Femara discussed and patient agrees to plan, will discuss with her husband as well. She is aware to start with cycle day 5 on 05/04/18.    Routing to provider for final review and will close.

## 2018-04-30 NOTE — Telephone Encounter (Signed)
Patient has started her cycle and was told to call and schedule a SHMG.

## 2018-05-02 ENCOUNTER — Encounter: Payer: Self-pay | Admitting: Obstetrics & Gynecology

## 2018-05-02 ENCOUNTER — Telehealth: Payer: Self-pay | Admitting: Obstetrics & Gynecology

## 2018-05-02 ENCOUNTER — Other Ambulatory Visit: Payer: Self-pay | Admitting: Obstetrics & Gynecology

## 2018-05-02 NOTE — Telephone Encounter (Signed)
Spoke with patient. LMP 04/30/18. Patient states she was advised to start Femara on 9/21, take days 5-9 of cycle. Femara Rx was for 12 tabs. Patient calling to confirm Rx instructions. [facility]  Advised I will review with Dr. Hyacinth MeekerMiller to confirm and return call, patient agreeable.     Dr. Hyacinth MeekerMiller -ok to send Rx for Femara 2.5 mg tab #3/0RF (7.5mg ) po daily to total 15 tabs. LMP 9/17-please confirm start date for Femara.

## 2018-05-02 NOTE — Telephone Encounter (Signed)
She is correct, the RX should have been for #15 to complete five days.  Can you call the pharmacy and see if they will dispense 3 more tablets or does she need a new prescription.  If she does, please send it in for 6 days (#18) so she has enough for next month if needed.  Thanks.

## 2018-05-02 NOTE — Telephone Encounter (Signed)
Patient sent the following correspondence through MyChart. Routing to triage to assist patient with request.  Good morning,   One of the meds I was prescribed, Letrozole (quantity: 12), reads:     "Take three tablets by mouth daily for four days. Take on cycle days 5-9. Start on 05/04/18."    I processed that as, I take 3 pills, daily, on days 5, 6, 7, 8, *and* 9. However, the packaging says there are 12 tablets. Does that mean I actually stop at the end of day 8, or am I supposed to have 15 tablets to include day 9?    Also, does it matter if I take all 3 at one time, or should I spread them apart?  Thank you for your help,   Sabrina Richardson

## 2018-05-03 MED ORDER — LETROZOLE 2.5 MG PO TABS: 7.5000 mg | ORAL_TABLET | Freq: Every day | ORAL | 0 refills | Status: DC

## 2018-05-03 NOTE — Telephone Encounter (Signed)
Left detailed message, ok per dpr. Advised as seen below. F/u with pharmacy for filling. Return call to office if any additional questions. Encounter closed.

## 2018-05-03 NOTE — Telephone Encounter (Signed)
[facility] New Rx for Femara 7.5 daily #18/0RF sent.

## 2018-05-03 NOTE — Telephone Encounter (Signed)
Spoke with patient, advised as seen below per Dr. Hyacinth MeekerMiller. Advised I will f/u with the pharmacy after they open and return call. Patient is agreeable.

## 2018-05-09 ENCOUNTER — Ambulatory Visit (INDEPENDENT_AMBULATORY_CARE_PROVIDER_SITE_OTHER): Payer: BC Managed Care – PPO

## 2018-05-09 ENCOUNTER — Ambulatory Visit: Payer: BC Managed Care – PPO | Admitting: Obstetrics & Gynecology

## 2018-05-09 ENCOUNTER — Other Ambulatory Visit: Payer: Self-pay | Admitting: Obstetrics & Gynecology

## 2018-05-09 VITALS — BP 102/80 | HR 88 | Resp 16 | Ht 61.0 in | Wt 125.4 lb

## 2018-05-09 DIAGNOSIS — N979 Female infertility, unspecified: Secondary | ICD-10-CM

## 2018-05-09 DIAGNOSIS — N970 Female infertility associated with anovulation: Secondary | ICD-10-CM | POA: Diagnosis not present

## 2018-05-09 NOTE — Progress Notes (Signed)
37 y.o. G0 Married [place] female here for a pelvic ultrasound with sonohystogram due to evaluate her uterine cavity and also to evaluate for tubal patency.  Her AMH is 24.  She is aware this is commonly seen with PCOS.  Patient's last menstrual period was 04/30/2018.  Contraception: none.  Has not been SA since LMP.  Technique:  Both transabdominal and transvaginal ultrasound examinations of the pelvis were performed. Transabdominal technique was performed for global imaging of the pelvis including uterus, ovaries, adnexal regions, and pelvic cul-de-sac.  It was necessary to proceed with endovaginal exam following the abdominal ultrasound transabdominal exam to visualize the endometrium and adnexa.  Color and duplex Doppler ultrasound was utilized to evaluate blood flow to the ovaries.   FINDINGS: Uterus: 6.4 x 3.5 x 2.8cm Endometrium: 3.90mm Adnexa:  Left: 3.9 x 3.4 x 2.3cm, volume 16cm3.  11mm follicle noted.     Right:  4.4 x 2.9 x 2.9cm, volume 19cm3.  11mm follicle noted. Cul de sac: small amt of free fluid  SHSG:  After obtaining appropriate verbal consent from patient, the cervix was visualized using a speculum, and prepped with betadine.  A tenaculum  was applied to the cervix.  Dilation of the cervix was necessary. The catheter was passed into the uterus and sterile saline introduced, with the following findings: no intracavitary lesions and normal spill noted from each tube.  Discussion:  Findings reviewed with pt.  PCOS likely given AMH and ovarian volume.  Will communicate with Dr. April Manson to see if Glucophage would be beneficial with this pt.  We are planning to proceed with day 23 progesterone this month.  If this is low, will consider repeating Femara one more time with progesterone level.  If this is still low, will need REI referral.  Pt voices understanding.  Questions answered.  Assessment: Primary infertility Probable PCOS Likely oligo ovulation H/O chlamydia in  2009  Plan:   Proceed with Day 23 progesterone level.  Will communicate back with pt once I have discussed with Dr. April Manson.  All questions answered.   Doxycycline 100mg  bid x 3 days  ~25 minutes spent with patient >50% of time was in face to face discussion of above.

## 2018-05-12 ENCOUNTER — Encounter: Payer: Self-pay | Admitting: Obstetrics & Gynecology

## 2018-05-22 ENCOUNTER — Other Ambulatory Visit (INDEPENDENT_AMBULATORY_CARE_PROVIDER_SITE_OTHER): Payer: BC Managed Care – PPO

## 2018-05-22 DIAGNOSIS — N970 Female infertility associated with anovulation: Secondary | ICD-10-CM

## 2018-05-23 LAB — PROGESTERONE: Progesterone: 0.8 ng/mL

## 2018-05-24 ENCOUNTER — Other Ambulatory Visit: Payer: Self-pay | Admitting: Obstetrics & Gynecology

## 2018-05-24 DIAGNOSIS — N979 Female infertility, unspecified: Secondary | ICD-10-CM

## 2018-05-24 DIAGNOSIS — N970 Female infertility associated with anovulation: Secondary | ICD-10-CM

## 2018-05-24 DIAGNOSIS — E282 Polycystic ovarian syndrome: Secondary | ICD-10-CM

## 2018-05-27 ENCOUNTER — Telehealth: Payer: Self-pay | Admitting: *Deleted

## 2018-05-27 MED ORDER — METFORMIN HCL 500 MG PO TABS: ORAL_TABLET | ORAL | 1 refills | Status: DC

## 2018-05-27 NOTE — Telephone Encounter (Signed)
-----   Message from Jerene Bears, MD sent at 05/24/2018  6:18 AM EDT ----- Please let pt know her progesterone level did not show ovulation.  She needs to start Femara 7.5mg  days 5-9 of next cycle.  She also should start metformin 500mg  daily x 7 days and then increased to 500mg  BID.  This will help make the ovaries respond better to the metformin.  She needs to call when she starts her cycle to let us know what day she will take the [facility] and then will plan the progesterone level again.  I did communicate with Dr. April Manson.  She is aware her AMH is elevated c/w PCOS.  This sometimes indicates that the Femara is not going to work very well and that she will need to use injections to help stimulate the ovaries.  He recommended trying to increase the metformin to 1000mg  BID but this sometimes this causes too much GI distress (diarrhea) so I will use the metformin 500mg  BID this month and then increase to 1000mg  BID next month (if she can tolerate it).  If there is no ovulation during these two months, she will need a referral to him to proceed with injectable medications for ovarian stimulation.  I'm going to go ahead and place a referral to him because it is often more than 6-8 weeks for an appt.  That way, we have it if we need it.

## 2018-05-27 NOTE — Telephone Encounter (Signed)
Spoke with patient, advised as seen below per Dr. Hyacinth Meeker.   LMP 04/30/18. Patient does not need RX for Femara, previously sent. Patient to call on first day of menses to schedule progesterone lab.  Rx for Metformin to verified pharmacy. Instructed patient to return call to office provide update on metformin, will determine if OK to increase metformin to 1000mg  for the 2nd month, will need new RX. Is aware to call with any concerns.   Patient states she has an appt with Dr. April Manson for 08/26/2018.   Routing to provider for final review. Patient is agreeable to disposition. Will close encounter.

## 2018-05-27 NOTE — Telephone Encounter (Signed)
Patient is returning a call to Jill. °

## 2018-05-27 NOTE — Telephone Encounter (Signed)
She should start at 500mg  and then increase to 1000mg .  If not having any GI distress, can increased further to 1000mg  BID but she should do this slowly to minimized GI distress.  Please let her know this and to call with cycle onset so we know when to repeat her progesterone level.  Thanks.

## 2018-05-27 NOTE — Telephone Encounter (Signed)
Notes recorded by Leda Min, RN on 05/27/2018 at 3:23 PM EDT Left message to call Noreene Larsson, RN at [facility] [phone].

## 2018-05-28 ENCOUNTER — Encounter: Payer: Self-pay | Admitting: Obstetrics & Gynecology

## 2018-05-28 ENCOUNTER — Telehealth: Payer: Self-pay | Admitting: Obstetrics & Gynecology

## 2018-05-28 NOTE — Telephone Encounter (Signed)
Spoke with patient, advised as seen below per Dr. Hyacinth Meeker. Patient repeated back instructions for metformin.   Patient is aware to return call to office on first day of menses to schedule progesterone lab.   Patient verbalizes understanding and is agreeable. Is aware to return call with any concerns/questions.

## 2018-05-28 NOTE — Telephone Encounter (Signed)
Patient sent the following correspondence through MyChart. Routing to triage to assist patient with request.  Good afternoon,   I received some calls from your office this week about taking Metformin, and I was instructed to make contact when my cycle begins again.     I started Metformin this morning, 1 pill, and soon after I noticed spotting (I was a bit surprised since my cycle lengths have been longer). I assumed it was the start of my period, but it seems unusually light at this time. If spotting continues, shall I go ahead and start the Femara on Saturday and plan for the next progesterone test?    Sincerely,   Sabrina Richardson

## 2018-05-28 NOTE — Telephone Encounter (Signed)
Dr. Hyacinth Meeker - ok for Femara 10/19 -10/23 and return for progesterone labs on 06/17/18?

## 2018-05-29 NOTE — Telephone Encounter (Signed)
Left message to call Loranda Mastel, RN at [facility] [phone].   

## 2018-05-29 NOTE — Telephone Encounter (Signed)
Spoke with patient. Patient reports bleeding has become lighter, does not feel like this is her menses. Advised as seen below per Dr. Hyacinth Meeker. Patient will wait to start Femara, does not need additional Rx. Is aware to return call to advise when menses starts.   Routing to provider for final review. Patient is agreeable to disposition. Will close encounter.

## 2018-05-29 NOTE — Telephone Encounter (Signed)
If the spotting turns into a full cycle, then yes she should take the Femara on day 5 of real bleeding.  If it is just spotting, she should wait until the bleeding actually begins.  Ok to send in rx for her if needed but I don't think she needs one right now.

## 2018-05-30 ENCOUNTER — Telehealth: Payer: Self-pay | Admitting: Obstetrics & Gynecology

## 2018-05-30 ENCOUNTER — Encounter: Payer: Self-pay | Admitting: Obstetrics & Gynecology

## 2018-05-30 DIAGNOSIS — N970 Female infertility associated with anovulation: Secondary | ICD-10-CM

## 2018-05-30 NOTE — Telephone Encounter (Signed)
Routing to Dr. Miller to review and advise.  

## 2018-05-30 NOTE — Telephone Encounter (Signed)
Message   Hello,   I talked with Sabrina Richardson about my "spotting" (that I wasn't sure if it was an actual period, making it difficult for me to tell when I should start femara). The first two days were exceptionally light spotting but today was a little heavier (perhaps I'm just having a light period this month). Therefore, should I consider the first day of spotting as Day 1 of my cycle (Tuesday), or the first day of more noticeable bleeding as Day 1 (like today)? If I consider the first spotting day as Day 1, then I would start femara on Saturday (Day 5), from my understanding.   Thank you as always for your assistance,   Sabrina Richardson

## 2018-05-31 NOTE — Telephone Encounter (Signed)
Day 1 is first day of real bleeding.  Please let her know this.  She could take the [facility] on day 3 or 4 as well this month, just because the bleeding was a little more irregular.  Please ask her to let us know so we can decide when to repeat the progesterone level.  Thanks.

## 2018-05-31 NOTE — Telephone Encounter (Signed)
Left detailed message, ok per dpr. Advised as seen below per Dr. Hyacinth Meeker. Advised to return call to office if any additional questions. Also return call to office to advise when you decide to start the Femara so we can advise on progesterone labs.

## 2018-05-31 NOTE — Telephone Encounter (Signed)
Patient returning Jill's call.  °

## 2018-05-31 NOTE — Telephone Encounter (Signed)
Left message to call Stephen Turnbaugh, RN at [facility] [phone].   

## 2018-06-03 ENCOUNTER — Encounter: Payer: Self-pay | Admitting: Obstetrics & Gynecology

## 2018-06-03 NOTE — Telephone Encounter (Signed)
Patient sent the following correspondence through MyChart. Routing to triage to assist patient with request.  Good afternoon,   I believe I was asked to notify the office when bleeding occured in order for the next progesterone screening to be scheduled. It was suggested I not include the unusually light spotting (which happened Tues Oct. 15 and Wed Oct. 16), so I would consider the actual period to have begun on Thursday, October 17. I began Femara (in combination with the Metformin I'm still taking) on today, Day 5/Monday, October 21.   Sincerely,   Sabrina Richardson

## 2018-06-04 NOTE — Telephone Encounter (Signed)
Progesterone level should be done on Friday, November 8th.  This is day 23 of her cycle.  Ok to schedule.

## 2018-06-04 NOTE — Telephone Encounter (Signed)
Left detailed message, ok per dpr. Advised as seen below per Dr. Hyacinth Meeker. Instructed patient to return call to office to schedule lab appt.   Future lab order placed for progesterone.

## 2018-06-04 NOTE — Telephone Encounter (Signed)
Dr. Hyacinth Meeker -please advise on progesterone labs?

## 2018-06-10 ENCOUNTER — Telehealth: Payer: Self-pay | Admitting: Obstetrics & Gynecology

## 2018-06-10 ENCOUNTER — Encounter: Payer: Self-pay | Admitting: Obstetrics & Gynecology

## 2018-06-10 NOTE — Telephone Encounter (Signed)
Ok to increase metformin to 1000mg  BID.  I usually recommend 1000mg  q am and 500mg  qpm for another week or two before the 1000mg  bid.  Ok to sent in rx.  Flu shot absolutely ok.

## 2018-06-10 NOTE — Telephone Encounter (Signed)
Dr. Hyacinth Meeker -  1. ok to increase metformin to 1000mg  bid? If so, will need new Rx, please advise on refills.   2. Ok to proceed with flu vaccine?

## 2018-06-10 NOTE — Telephone Encounter (Signed)
Patient sent the following correspondence through MyChart. Routing to triage to assist patient with request.  Good morning,  As instructed I took 500 mg of Metformin for 7 days, and increased to 1,000 mg the next 7 days. Today will be my 7th day on 1,000 mg Metformin. Could you remind me: Do I continue on 1,000 mg until the pills run out , or was I supposed to increase again (to 1,500) for the next 7 days (and if that is the case, do I take all three at different times of the day), etc?    Also, I was assuming it was safe to get the flu shot while on these meds. Is that true? I figure I'd double check.     Thank you for your assistance.   Sincerely,   Sabrina Richardson

## 2018-06-11 MED ORDER — METFORMIN HCL 1000 MG PO TABS: 1000.0000 mg | ORAL_TABLET | Freq: Two times a day (BID) | ORAL | 1 refills | Status: DC

## 2018-06-11 NOTE — Telephone Encounter (Signed)
Spoke with patient, advised as seen below per Dr. Hyacinth Meeker. Rx for metformin 1000 mg bid #60/1RF to verified pharmacy. Patient reports "very mild" GI changes since starting metformin. Will increase dosage as recommended, is aware to return call with any concerns.   Routing to provider for final review. Patient is agreeable to disposition. Will close encounter.

## 2018-06-11 NOTE — Telephone Encounter (Signed)
Left message to call Taiya Nutting, RN at [facility] [phone].   

## 2018-06-11 NOTE — Telephone Encounter (Signed)
Patient returned call

## 2018-06-21 ENCOUNTER — Other Ambulatory Visit (INDEPENDENT_AMBULATORY_CARE_PROVIDER_SITE_OTHER): Payer: BC Managed Care – PPO

## 2018-06-21 DIAGNOSIS — N970 Female infertility associated with anovulation: Secondary | ICD-10-CM

## 2018-06-22 LAB — PROGESTERONE: PROGESTERONE: 2.1 ng/mL

## 2018-06-26 ENCOUNTER — Telehealth: Payer: Self-pay | Admitting: *Deleted

## 2018-06-26 NOTE — Telephone Encounter (Signed)
Spoke with patient, advised as seen below per Dr. Hyacinth MeekerMiller. Patient has additional questions.   1. Patient would like to take Femara again, RX pended.   2. Should she continue metformin until she sees Dr. April MansonYalcinkaya? Currently taking 1,000mg  bid.    Routing to Dr. Hyacinth MeekerMiller to review.

## 2018-06-26 NOTE — Telephone Encounter (Signed)
Patient returning call to Emily.

## 2018-06-26 NOTE — Telephone Encounter (Signed)
Message left to return call to Triage Nurse at [phone].    

## 2018-06-26 NOTE — Telephone Encounter (Signed)
-----   Message from Jerene BearsMary S Miller, MD sent at 06/26/2018  2:21 PM EST ----- Please let pt know her progesterone level again did not show ovulation.  She is seeing Dr. April MansonYalcinkaya in January, I believe.  She absolutely needs to keep this appt.  Ok to take Femara again if desired but ok to wait until appt as well.  Thanks.

## 2018-06-27 MED ORDER — LETROZOLE 2.5 MG PO TABS: 7.5000 mg | ORAL_TABLET | Freq: Every day | ORAL | 0 refills | Status: DC

## 2018-06-27 NOTE — Telephone Encounter (Signed)
Patient notified of refill. Advised as seen below per Dr. Hyacinth MeekerMiller. Patient verbalizes understanding and is agreeable.  Encounter closed.

## 2018-06-27 NOTE — Telephone Encounter (Signed)
Ok to take Femara again.  Yes, she should continue the metformin if she is tolerating it.  Thanks.

## 2018-07-15 ENCOUNTER — Telehealth: Payer: Self-pay | Admitting: Obstetrics & Gynecology

## 2018-07-15 ENCOUNTER — Encounter: Payer: Self-pay | Admitting: Obstetrics & Gynecology

## 2018-07-15 NOTE — Telephone Encounter (Signed)
Patient sent the following correspondence through MyChart. Routing to triage to assist patient with request.  Good afternoon,   I've been on metformin (and I recently finished my last round of femara last week), but lately the metformin has been a bit more difficult for me to handle, and I threw up on Saturday and have been hesitant to continue the dosage. Are there any immediate concerns of me taking a "break" in December and restarting in Jan. when I see your referral ([facility])?    Also, I elected to continue femara one more time (which I just finished), but I'm not supposed to schedule another blood test for progesterone, correct?    Thank you for your time,   Sabrina Richardson

## 2018-07-15 NOTE — Telephone Encounter (Signed)
She does not need another progesterone level.  She has not ovulated with the prior doses of Femara so we do not need to test this again.    If she is willing to try decreasing the dosage to 500mg  twice daily or even just 500mg  daily.  It does help with whatever treatment they decide to proceed with next.

## 2018-07-15 NOTE — Telephone Encounter (Signed)
Dr. Hyacinth MeekerMiller,  Can you review mychart message.  Pt is asking to DC Metformin due to GI side effects.  On  1,000 mg BID.  Has appointment with Dr. April MansonYalcinkaya in January.  Has not been ovulating.  Okay to DC Metformin? And she does not need another progesterone level?

## 2018-07-16 NOTE — Telephone Encounter (Signed)
Spoke with patient and message from Dr. Hyacinth MeekerMiller discussed. She is agreeable to new order for Metformin and will try to decrease the dose.    She will call with any problems with splitting the pills for a new Rx.  She will call back with any concerns prior to follow up with Dr. April MansonYalcinkaya. Encounter closed.

## 2018-08-26 LAB — OB RESULTS CONSOLE RPR: RPR: NONREACTIVE

## 2018-09-16 ENCOUNTER — Other Ambulatory Visit: Payer: Self-pay | Admitting: Obstetrics & Gynecology

## 2018-09-16 NOTE — Telephone Encounter (Signed)
Left message at the number on DPR.  Asking patient how she was doing on the 500mg  Metformin and to confirm if she needs a refill of the medication.

## 2018-09-16 NOTE — Telephone Encounter (Signed)
Patient returned call

## 2018-09-16 NOTE — Telephone Encounter (Signed)
Medication refill request: Metformin 1000 mg  Last AEX:  02/15/18 Next AEX: 02/26/19 with DL  Last MMG (if hormonal medication request): NA Refill authorized:  Spoke with patient she states that she is not taking 1000mg  with her evening meal and feels like she is doing well. She wants to work her way up to 2000mg  daily soon. Please advice.

## 2018-09-19 ENCOUNTER — Other Ambulatory Visit: Payer: Self-pay | Admitting: Obstetrics & Gynecology

## 2018-09-19 NOTE — Telephone Encounter (Signed)
Please let pt know I sent rx to pharmacy but changed dosage to 500mg  so she could titatrate up the pm dosage.  rx if for 500mg  tabs (two in the AM for total of 1000mg ) and then 500mg  qhs for two weeks.  Then she should increase to 1000mg  at night.

## 2018-09-19 NOTE — Telephone Encounter (Signed)
Patient notified.  Verbalized understanding. 

## 2019-02-25 ENCOUNTER — Other Ambulatory Visit: Payer: Self-pay

## 2019-02-26 ENCOUNTER — Ambulatory Visit: Payer: BC Managed Care – PPO | Admitting: Certified Nurse Midwife

## 2019-02-26 ENCOUNTER — Other Ambulatory Visit: Payer: Self-pay

## 2019-02-26 ENCOUNTER — Encounter: Payer: Self-pay | Admitting: Certified Nurse Midwife

## 2019-02-26 VITALS — BP 120/70 | HR 68 | Temp 97.2°F | Resp 16 | Ht 61.25 in | Wt 122.0 lb

## 2019-02-26 DIAGNOSIS — Z8742 Personal history of other diseases of the female genital tract: Secondary | ICD-10-CM | POA: Diagnosis not present

## 2019-02-26 DIAGNOSIS — Z01419 Encounter for gynecological examination (general) (routine) without abnormal findings: Secondary | ICD-10-CM

## 2019-02-26 NOTE — Progress Notes (Signed)
38 y.o. G0P0 Married  African American Fe here for annual exam. Periods normal, no issues. Contraception none trying for pregnancy. Learning more about PCOS and saw Endocrinologist/Infertility and started on Metformin, not tolerating 2000 mg working on increasing from 1000 mg up again. Had more diarrhea with use. No other concerns today.  Patient's last menstrual period was 02/17/2019.          Sexually active: Yes.    The current method of family planning is none.    Exercising: Yes.    walking Smoker:  no  Review of Systems  Constitutional: Negative.   HENT: Negative.   Eyes: Negative.   Respiratory: Negative.   Cardiovascular: Negative.   Gastrointestinal: Negative.   Genitourinary: Negative.   Musculoskeletal: Negative.   Skin: Negative.   Neurological: Negative.   Endo/Heme/Allergies: Negative.   Psychiatric/Behavioral: Negative.     Health Maintenance: Pap:  02-15-18 neg HPV HR neg History of Abnormal Pap: no MMG:  none Self Breast exams: no Colonoscopy:  none BMD:   none TDaP:  2013 Shingles: no Pneumonia: no Hep C and HIV: hep c neg 2015, HIV neg 2016 Labs: if needed   reports that she has never smoked. She has never used smokeless tobacco. She reports that she does not drink alcohol or use drugs.  Past Medical History:  Diagnosis Date  . Chlamydia contact, treated 2009  . Migraines 2007   per pt none now    No past surgical history on file.  Current Outpatient Medications  Medication Sig Dispense Refill  . letrozole (FEMARA) 2.5 MG tablet Take 3 tablets (7.5 mg total) by mouth daily. Take on cycle days 5-9. 15 tablet 0  . metFORMIN (GLUCOPHAGE) 500 MG tablet 1000mg  qam and then 500mg  qpm x 14 days.  After that, increase to 1000mg  qam and 1000mg  qpm 120 tablet 1  . Prenatal Vit-Fe Fumarate-FA (PRENATAL VITAMIN PO) Take by mouth daily.     No current facility-administered medications for this visit.     Family History  Problem Relation Age of Onset  .  Diabetes Mother   . Hypertension Father   . Hyperlipidemia Father   . Diabetes Maternal Grandmother   . Diabetes Paternal Grandfather     ROS:  Pertinent items are noted in HPI.  Otherwise, a comprehensive ROS was negative.  Exam:   BP 120/70   Pulse 68   Temp (!) 97.2 F (36.2 C) (Skin)   Resp 16   Ht 5' 1.25" (1.556 m)   Wt 122 lb (55.3 kg)   LMP 02/17/2019   BMI 22.86 kg/m  Height: 5' 1.25" (155.6 cm) Ht Readings from Last 3 Encounters:  02/26/19 5' 1.25" (1.556 m)  05/09/18 5\' 1"  (1.549 m)  04/05/18 5\' 1"  (1.549 m)    General appearance: alert, cooperative and appears stated age Head: Normocephalic, without obvious abnormality, atraumatic Neck: no adenopathy, supple, symmetrical, trachea midline and thyroid normal to inspection and palpation Lungs: clear to auscultation bilaterally Breasts: normal appearance, no masses or tenderness, No nipple retraction or dimpling, No nipple discharge or bleeding, No axillary or supraclavicular adenopathy Heart: regular rate and rhythm Abdomen: soft, non-tender; no masses,  no organomegaly Extremities: extremities normal, atraumatic, no cyanosis or edema Skin: Skin color, texture, turgor normal. No rashes or lesions Lymph nodes: Cervical, supraclavicular, and axillary nodes normal. No abnormal inguinal nodes palpated Neurologic: Grossly normal   Pelvic: External genitalia:  no lesions  Urethra:  normal appearing urethra with no masses, tenderness or lesions              Bartholin's and Skene's: normal                 Vagina: normal appearing vagina with normal color and discharge, no lesions              Cervix: no cervical motion tenderness, no lesions and nulliparous appearance              Pap taken: No. Bimanual Exam:  Uterus:  normal size, contour, position, consistency, mobility, non-tender and anteverted              Adnexa: normal adnexa and no mass, fullness, tenderness               Rectovaginal: Confirms                Anus:  normal sphincter tone, no lesions  Chaperone present: yes  A:  Well Woman with normal exam  Contraception none trying for pregnancy, working with Infertility  PCOS on Metformin, adjusting to dosage change  P:   Reviewed health and wellness pertinent to exam  Discussed emotions with infertility, working at home and PCOS. Spouse very supportive. Feels she is working through the changes OK.  Continue follow up as needed with labs and medication as instructed.  Declines labs today  Pap smear: no   counseled on breast self exam, feminine hygiene, adequate intake of calcium and vitamin D, diet and exercise  return annually or prn  An After Visit Summary was printed and given to the patient.

## 2019-03-13 ENCOUNTER — Encounter: Payer: Self-pay | Admitting: Obstetrics & Gynecology

## 2019-03-14 ENCOUNTER — Encounter: Payer: Self-pay | Admitting: Obstetrics & Gynecology

## 2019-03-14 ENCOUNTER — Ambulatory Visit: Payer: BC Managed Care – PPO | Admitting: Obstetrics & Gynecology

## 2019-03-14 ENCOUNTER — Other Ambulatory Visit: Payer: Self-pay

## 2019-03-14 ENCOUNTER — Telehealth: Payer: Self-pay | Admitting: Obstetrics & Gynecology

## 2019-03-14 VITALS — BP 118/80 | HR 96 | Temp 97.7°F | Ht 61.25 in | Wt 121.2 lb

## 2019-03-14 DIAGNOSIS — N912 Amenorrhea, unspecified: Secondary | ICD-10-CM

## 2019-03-14 LAB — POCT URINE PREGNANCY: Preg Test, Ur: POSITIVE — AB

## 2019-03-14 NOTE — Telephone Encounter (Signed)
Spoke with patient. Confirmed LMP 7/6 -7/10. Several positive UPT on 7/29. No vaginal bleeding. Mild "menses like cramps" 2/10. Denies N/V, fever/chills. Call reviewed with Dr. Sabra Heck. Call returned to patient.   OV scheduled for today at 4:15pm with Dr. Sabra Heck. Patient will have hcg quant and ABO/Rh labs. Patient verbalizes understanding and is agreeable. [id number] prescreen neg, precautions reviewed.   Encounter closed.

## 2019-03-14 NOTE — Progress Notes (Signed)
GYNECOLOGY  VISIT  CC:   Positive pregnancy test  HPI: 38 y.o. G0P0 Married Sabrina Richardson here for pregnancy confirmation.  She did a UPT at home today.  They have been trying for about three years.   She has seen Dr. Kerin Perna for consultation.  This was in January.  They were planning if no pregnancy by the end of the summer to proceed with IVF.  She is currently taking 1000mg  metformin daily.  Wants to know when to stop.  Advised to continue for now.    She is having some mild cramping and taste changes but no vaginal bleeding.  [facility] 11/24/2019.  She is about 3 1/2 weeks today.    GYNECOLOGIC HISTORY: Patient's last menstrual period was 02/17/2019. Contraception: none Menopausal hormone therapy: none  Patient Active Problem List   Diagnosis Date Noted  . PCOS (polycystic ovarian syndrome) 05/24/2018    Past Medical History:  Diagnosis Date  . Chlamydia contact, treated 2009  . Migraines 2007   per pt none now  . PCOS (polycystic ovarian syndrome)     No past surgical history on file.  MEDS:   Current Outpatient Medications on File Prior to Visit  Medication Sig Dispense Refill  . metFORMIN (GLUCOPHAGE) 500 MG tablet 1000mg  qam and then 500mg  qpm x 14 days.  After that, increase to 1000mg  qam and 1000mg  qpm (Patient taking differently: 1,000 mg. ) 120 tablet 1  . Prenatal Vit-Fe Fumarate-FA (PRENATAL VITAMIN PO) Take by mouth daily.     No current facility-administered medications on file prior to visit.     ALLERGIES: Patient has no known allergies.  Family History  Problem Relation Age of Onset  . Diabetes Mother   . Hypertension Father   . Hyperlipidemia Father   . Diabetes Maternal Grandmother   . Diabetes Paternal Grandfather     SH:  Married, non smoker  Review of Systems  Genitourinary:       Mild cramping   All other systems reviewed and are negative.   PHYSICAL EXAMINATION:    BP 118/80   Pulse 96   Temp 97.7 F (36.5 C)  (Temporal)   Ht 5' 1.25" (1.556 m)   Wt 121 lb 3.2 oz (55 kg)   LMP 02/17/2019   BMI 22.71 kg/m     General appearance: alert, cooperative and appears stated age   Assessment: Amenorrhea  Plan: HCG and blood type obtained today. Repeat HCG next week.  Will communicate Plan PUS around 8/20 or 8/24   ~15 minutes spent with patient >50% of time was in face to face discussion of above.

## 2019-03-14 NOTE — Telephone Encounter (Signed)
Patient sent the following correspondence through [place]. Routing to triage to assist patient with request.  Good morning Dr. Sabra Heck,  On July 29 I had several positive pregnancy tests (to my surprise) and I am seeking advice on next steps. I would like to get confirmation of my results and I am also wondering if I should continue metformin you prescribed last year.     FYI:  Sabrina Richardson referred me to you last year concerning potential infertility. I had several appointments with you in Aug/Sept 2019 and I was referred to Dr. Darreld Mclean at [facility] I met with him early this year (Jan 2020) considered the information he presented and then of course COVID struck the world so I paused with further IVF considerations. I have been on the metformin you prescribed all along, though I'm currently working my way back to 2000 mg as I briefly fell out of routine at the start of the pandemic (I have been steadily taking 1045m trying to return to 2000). I had my annual checkup with DMelvia Heapsthis month and my last period was July 6-10.    Thank you for your time,   Sabrina Richardson

## 2019-03-15 ENCOUNTER — Encounter: Payer: Self-pay | Admitting: Obstetrics & Gynecology

## 2019-03-15 LAB — ABO AND RH: Rh Factor: POSITIVE

## 2019-03-15 LAB — BETA HCG QUANT (REF LAB): hCG Quant: 188 m[IU]/mL

## 2019-03-17 ENCOUNTER — Telehealth: Payer: Self-pay | Admitting: *Deleted

## 2019-03-17 DIAGNOSIS — Z3201 Encounter for pregnancy test, result positive: Secondary | ICD-10-CM

## 2019-03-17 DIAGNOSIS — N912 Amenorrhea, unspecified: Secondary | ICD-10-CM

## 2019-03-17 NOTE — Telephone Encounter (Signed)
Good evening,   Thank you for the test result info.  You mentioned, "You hcg level was 188. This is good. I do want to repeat this next week. If you know when you want to come in, you can tell me the date and time and I will put it on the schedule."    Would 8am Thursday work? 8am Friday would be fine as well.    Sincerely,   Yves Dill

## 2019-03-17 NOTE — Telephone Encounter (Signed)
Spoke with patient. Lab appt scheduled for 8/6 at 8:45am for STAT bhcg. Patient denies vaginal bleeding, reports mild cramping, unchanged from 7/31 OV.   ER precautions reviewed for sever pain or bleeding. Patient is aware to call if any questions/concerns.   Future lab order placed for STAT BHcg.    Routing to provider for final review. Patient is agreeable to disposition. Will close encounter.

## 2019-03-18 ENCOUNTER — Other Ambulatory Visit: Payer: Self-pay | Admitting: Obstetrics & Gynecology

## 2019-03-19 NOTE — Telephone Encounter (Signed)
Medication refill request: metformin 500mg  Last AEX:  02-26-2019 Next AEX: 03-02-2019 Last OV: 03-14-2019 UPT + Last MMG (if hormonal medication request): n/a Refill authorized: please approve if appropriate

## 2019-03-20 ENCOUNTER — Other Ambulatory Visit (INDEPENDENT_AMBULATORY_CARE_PROVIDER_SITE_OTHER): Payer: BC Managed Care – PPO

## 2019-03-20 ENCOUNTER — Other Ambulatory Visit: Payer: Self-pay

## 2019-03-20 ENCOUNTER — Telehealth: Payer: Self-pay | Admitting: *Deleted

## 2019-03-20 DIAGNOSIS — Z3201 Encounter for pregnancy test, result positive: Secondary | ICD-10-CM

## 2019-03-20 DIAGNOSIS — N912 Amenorrhea, unspecified: Secondary | ICD-10-CM

## 2019-03-20 DIAGNOSIS — Z8742 Personal history of other diseases of the female genital tract: Secondary | ICD-10-CM

## 2019-03-20 LAB — BETA HCG QUANT (REF LAB): hCG Quant: 2909 m[IU]/mL

## 2019-03-20 NOTE — Telephone Encounter (Signed)
Spoke with patient, advised as seen below per Dr. Sabra Heck. PUS scheduled for 8/20 at 1pm, consult to follow at 1:30pm with Dr. Sabra Heck. Order placed for precert.   Routing to provider for final review. Patient is agreeable to disposition. Will close encounter.  Cc: Lerry Liner

## 2019-03-20 NOTE — Telephone Encounter (Signed)
03/20/19 STAT Beta Hcg 2,909  Routing to Dr. Sabra Heck.

## 2019-03-20 NOTE — Telephone Encounter (Signed)
Routed result note to you.  HCG rising appropriately and she can proceed with scheduling ultrasound around 8/20 and 8/24.

## 2019-04-01 ENCOUNTER — Other Ambulatory Visit: Payer: Self-pay

## 2019-04-03 ENCOUNTER — Encounter: Payer: Self-pay | Admitting: Obstetrics & Gynecology

## 2019-04-03 ENCOUNTER — Ambulatory Visit: Payer: BC Managed Care – PPO | Admitting: Obstetrics & Gynecology

## 2019-04-03 ENCOUNTER — Ambulatory Visit (INDEPENDENT_AMBULATORY_CARE_PROVIDER_SITE_OTHER): Payer: BC Managed Care – PPO

## 2019-04-03 ENCOUNTER — Other Ambulatory Visit: Payer: Self-pay

## 2019-04-03 VITALS — BP 102/66 | HR 100 | Temp 97.9°F | Ht 61.25 in | Wt 119.2 lb

## 2019-04-03 DIAGNOSIS — Z3687 Encounter for antenatal screening for uncertain dates: Secondary | ICD-10-CM

## 2019-04-03 DIAGNOSIS — Z3201 Encounter for pregnancy test, result positive: Secondary | ICD-10-CM | POA: Diagnosis not present

## 2019-04-03 DIAGNOSIS — N912 Amenorrhea, unspecified: Secondary | ICD-10-CM

## 2019-04-03 DIAGNOSIS — Z8742 Personal history of other diseases of the female genital tract: Secondary | ICD-10-CM | POA: Diagnosis not present

## 2019-04-03 NOTE — Progress Notes (Signed)
38 y.o. G1P0 Married Sabrina Richardson American female here for pelvic ultrasound for viability.  LMP 02/17/2019.  EGA based on both LMP and ultrasound today is 6 3/7 weeks.    Patient's last menstrual period was 02/17/2019 (exact date).   Findings:  UTERUS: normal appearing gestational sac, yolk sac, singleton IUD with CRL of 0.62cm.  FCA at 128BMP. ADNEXA: Left ovary: 3.5 x 1.9cm       Right ovary: 3.8 x 2.5cm with 1.6cm CUL DE SAC: no free fluid noted  Discussion:  Findings reviewed with pt.  Questions answered.  No cats in the home.  Tdap 1/13.  Had chicken pox.  Aware flu vaccine safe and recommended.  Rubella status is immune.    Fish/shellfish/mercury discuss.  Patient does not eat fish.  Unpasteurized cheese/juices discussed.  Nitrites in foods disucssed.  Exercise and intercourse discussed.  Fetal DNA particle testing discussed.  First trimester down's testing discussed.  Cystic fibrosis discussed.  Sickle cell testing discussed.  Assessment:  Sabrina Richardson IUD AMA  Plan:  She is going to transfer care at this point.  Information given to her about this.  She will call and let me know where she is going to transfer care.  ~20 minutes spent with patient >50% of time was in face to face discussion of above.

## 2019-04-04 ENCOUNTER — Telehealth: Payer: Self-pay | Admitting: Obstetrics & Gynecology

## 2019-04-04 ENCOUNTER — Encounter: Payer: Self-pay | Admitting: Obstetrics & Gynecology

## 2019-04-04 NOTE — Telephone Encounter (Signed)
Patient sent the following correspondence through [place].  Good Morning,   Thank you for your help during yesterday's ultrasound visit. You had requested that I inform you of an OB I'm considering so they can have a copy of my health records.     I contacted [facility] today for a future appointment with Dr. Garwin Brothers and the office is now requesting my health records. Thank you for any help you and the office can provide.   Sincerely,   Sabrina Richardson   Cc: Sabrina Richardson

## 2019-04-04 NOTE — Telephone Encounter (Signed)
Message to front office staff to assist with records request.   Encounter closed.

## 2019-04-16 LAB — OB RESULTS CONSOLE HIV ANTIBODY (ROUTINE TESTING): HIV: NONREACTIVE

## 2019-04-16 LAB — OB RESULTS CONSOLE RUBELLA ANTIBODY, IGM: Rubella: IMMUNE

## 2019-04-16 LAB — OB RESULTS CONSOLE HEPATITIS B SURFACE ANTIGEN: Hepatitis B Surface Ag: NEGATIVE

## 2019-05-05 LAB — OB RESULTS CONSOLE GC/CHLAMYDIA
Chlamydia: NEGATIVE
Gonorrhea: NEGATIVE

## 2019-08-27 LAB — OB RESULTS CONSOLE RPR: RPR: NONREACTIVE

## 2019-10-09 LAB — OB RESULTS CONSOLE GBS: GBS: NEGATIVE

## 2019-10-31 ENCOUNTER — Encounter: Payer: Self-pay | Admitting: Certified Nurse Midwife

## 2019-11-16 ENCOUNTER — Other Ambulatory Visit: Payer: Self-pay

## 2019-11-16 ENCOUNTER — Encounter (HOSPITAL_COMMUNITY): Payer: Self-pay | Admitting: Obstetrics and Gynecology

## 2019-11-16 ENCOUNTER — Inpatient Hospital Stay (HOSPITAL_COMMUNITY)
Admission: AD | Admit: 2019-11-16 | Discharge: 2019-11-16 | Disposition: A | Discharge status: Home / Self Care | Payer: BC Managed Care – PPO | Source: Home / Self Care | Attending: Obstetrics and Gynecology | Admitting: Obstetrics and Gynecology

## 2019-11-16 ENCOUNTER — Inpatient Hospital Stay (HOSPITAL_COMMUNITY)
Admission: AD | Admit: 2019-11-16 | Discharge: 2019-11-17 | Disposition: A | Discharge status: Home / Self Care | Payer: BC Managed Care – PPO | Source: Home / Self Care | Attending: Obstetrics and Gynecology | Admitting: Obstetrics and Gynecology

## 2019-11-16 DIAGNOSIS — O471 False labor at or after 37 completed weeks of gestation: Secondary | ICD-10-CM

## 2019-11-16 DIAGNOSIS — N898 Other specified noninflammatory disorders of vagina: Secondary | ICD-10-CM | POA: Insufficient documentation

## 2019-11-16 DIAGNOSIS — Z3A38 38 weeks gestation of pregnancy: Secondary | ICD-10-CM | POA: Insufficient documentation

## 2019-11-16 DIAGNOSIS — O26893 Other specified pregnancy related conditions, third trimester: Secondary | ICD-10-CM | POA: Insufficient documentation

## 2019-11-16 DIAGNOSIS — Z3A39 39 weeks gestation of pregnancy: Secondary | ICD-10-CM | POA: Insufficient documentation

## 2019-11-16 NOTE — MAU Note (Signed)
Sabrina Richardson is a 39 y.o. at [redacted]w[redacted]d here in MAU reporting: contractions since yesterday but they are more frequent today. Now they are coming about every 4.5-6.5 minutes. Reports bleeding. States bleeding is in streaks of mucus. No LOF.  Onset of complaint: yesterday  Pain score: 8/10  Vitals:   11/16/19 1611  BP: 125/72  Pulse: 91  Resp: 16  Temp: 97.7 F (36.5 C)  SpO2: 99%     FHT: +FM  Lab orders placed from triage: none

## 2019-11-16 NOTE — MAU Note (Signed)
Pt reports to MAU c/o trickles of fluid with every ctx. Pt reports this started at 2000. The fluid is clear per patient. Pt reports her ctx are now closer than they were earlier today. Pt reports bloody show. +FM.

## 2019-11-16 NOTE — Discharge Instructions (Signed)
Labor precautions

## 2019-11-17 LAB — POCT FERN TEST: POCT Fern Test: NEGATIVE

## 2019-11-17 MED ORDER — NALBUPHINE HCL 10 MG/ML IJ SOLN
5.0000 mg | Freq: Once | INTRAMUSCULAR | Status: AC
  Administered 2019-11-17: 5 mg via INTRAMUSCULAR
  Filled 2019-11-17: qty 1

## 2019-11-17 MED ORDER — PROMETHAZINE HCL 25 MG/ML IJ SOLN
25.0000 mg | Freq: Once | INTRAMUSCULAR | Status: AC
  Administered 2019-11-17: 25 mg via INTRAMUSCULAR
  Filled 2019-11-17: qty 1

## 2019-11-17 NOTE — MAU Provider Note (Signed)
39 History     Chief Complaint  Patient presents with  . Rupture of Membranes  . Contractions   39 yo G1P0 MF @ 38 6/7 wks presents with c/o ctx and leaking fluid. (+) FM. Fern x 2 neg per RN. GBS cx -) active fetus   OB History     Gravida  1   Para      Term      Preterm      AB      Living         SAB      TAB      Ectopic      Multiple      Live Births              Past Medical History:  Diagnosis Date  . Chlamydia contact, treated 2009  . Migraines 2007   per pt none now  . PCOS (polycystic ovarian syndrome)     Past Surgical History:  Procedure Laterality Date  . NO PAST SURGERIES      Family History  Problem Relation Age of Onset  . Diabetes Mother   . Hypertension Father   . Hyperlipidemia Father   . Diabetes Maternal Grandmother   . Diabetes Paternal Grandfather     Social History   Tobacco Use  . Smoking status: Never Smoker  . Smokeless tobacco: Never Used  Substance Use Topics  . Alcohol use: No    Alcohol/week: 0.0 standard drinks  . Drug use: No    Allergies: No Known Allergies  Medications Prior to Admission  Medication Sig Dispense Refill Last Dose  . diphenhydramine-acetaminophen (TYLENOL PM) 25-500 MG TABS tablet Take 1 tablet by mouth at bedtime as needed.   11/16/2019 at Unknown time  . Prenatal Vit-Fe Fumarate-FA (PRENATAL VITAMIN PO) Take by mouth daily.   11/15/2019 at Unknown time     Physical Exam   Blood pressure 132/81, temperature (!) 97.4 F (36.3 C), resp. rate 20, last menstrual period 02/17/2019.  General appearance: alert, cooperative, and no distress Abdomen:  gravid mild palp ctx Pelvic: external genitalia normal and SSE cervix visually 1 cm clear sac noted mucoid clear fluid noted fern done Extremities: no edema, redness or tenderness in the calves or thighs  Tracing; baseline 130 (+) accels 160 Ctx q 3-5 mins ED Course  IMP: False labor >37 wk Leucorrhea of pregnancy P)  fern done.  nubain/phenergan for pain. Labor prec. No evidence of ROM. D/c home Keep OB appt MDM   Serita Kyle, MD 12:38 AM 11/17/2019

## 2019-11-17 NOTE — Discharge Instructions (Signed)
Labor precautions

## 2019-11-18 ENCOUNTER — Inpatient Hospital Stay (HOSPITAL_COMMUNITY)
Admission: AD | Admit: 2019-11-18 | Discharge: 2019-11-21 | DRG: 788 | Disposition: A | Discharge status: Home / Self Care | Payer: BC Managed Care – PPO | Attending: Obstetrics and Gynecology | Admitting: Obstetrics and Gynecology

## 2019-11-18 ENCOUNTER — Inpatient Hospital Stay (HOSPITAL_COMMUNITY): Payer: BC Managed Care – PPO | Admitting: Anesthesiology

## 2019-11-18 ENCOUNTER — Encounter (HOSPITAL_COMMUNITY)
Admission: AD | Disposition: A | Discharge status: Home / Self Care | Payer: Self-pay | Source: Home / Self Care | Attending: Obstetrics and Gynecology

## 2019-11-18 ENCOUNTER — Other Ambulatory Visit: Payer: Self-pay

## 2019-11-18 ENCOUNTER — Encounter (HOSPITAL_COMMUNITY): Payer: Self-pay | Admitting: Obstetrics and Gynecology

## 2019-11-18 DIAGNOSIS — O134 Gestational [pregnancy-induced] hypertension without significant proteinuria, complicating childbirth: Principal | ICD-10-CM | POA: Diagnosis present

## 2019-11-18 DIAGNOSIS — Z20822 Contact with and (suspected) exposure to covid-19: Secondary | ICD-10-CM | POA: Diagnosis present

## 2019-11-18 DIAGNOSIS — O9081 Anemia of the puerperium: Secondary | ICD-10-CM | POA: Diagnosis not present

## 2019-11-18 DIAGNOSIS — Z3A39 39 weeks gestation of pregnancy: Secondary | ICD-10-CM

## 2019-11-18 LAB — PROTEIN / CREATININE RATIO, URINE
Creatinine, Urine: 268.24 mg/dL
Protein Creatinine Ratio: 0.13 mg/mg{Cre} (ref 0.00–0.15)
Total Protein, Urine: 36 mg/dL

## 2019-11-18 LAB — TYPE AND SCREEN
ABO/RH(D): B POS
Antibody Screen: NEGATIVE

## 2019-11-18 LAB — CBC WITH DIFFERENTIAL/PLATELET
Abs Immature Granulocytes: 0.13 10*3/uL — ABNORMAL HIGH (ref 0.00–0.07)
Basophils Absolute: 0 10*3/uL (ref 0.0–0.1)
Basophils Relative: 0 %
Eosinophils Absolute: 0 10*3/uL (ref 0.0–0.5)
Eosinophils Relative: 0 %
HCT: 39.2 % (ref 36.0–46.0)
Hemoglobin: 13.2 g/dL (ref 12.0–15.0)
Immature Granulocytes: 1 %
Lymphocytes Relative: 9 %
Lymphs Abs: 1 10*3/uL (ref 0.7–4.0)
MCH: 31.6 pg (ref 26.0–34.0)
MCHC: 33.7 g/dL (ref 30.0–36.0)
MCV: 93.8 fL (ref 80.0–100.0)
Monocytes Absolute: 1 10*3/uL (ref 0.1–1.0)
Monocytes Relative: 10 %
Neutro Abs: 8.2 10*3/uL — ABNORMAL HIGH (ref 1.7–7.7)
Neutrophils Relative %: 80 %
Platelets: 239 10*3/uL (ref 150–400)
RBC: 4.18 MIL/uL (ref 3.87–5.11)
RDW: 14 % (ref 11.5–15.5)
WBC: 10.3 10*3/uL (ref 4.0–10.5)
nRBC: 0 % (ref 0.0–0.2)

## 2019-11-18 LAB — COMPREHENSIVE METABOLIC PANEL
ALT: 33 U/L (ref 0–44)
AST: 52 U/L — ABNORMAL HIGH (ref 15–41)
Albumin: 2.6 g/dL — ABNORMAL LOW (ref 3.5–5.0)
Alkaline Phosphatase: 199 U/L — ABNORMAL HIGH (ref 38–126)
Anion gap: 17 — ABNORMAL HIGH (ref 5–15)
BUN: 20 mg/dL (ref 6–20)
CO2: 18 mmol/L — ABNORMAL LOW (ref 22–32)
Calcium: 9.2 mg/dL (ref 8.9–10.3)
Chloride: 101 mmol/L (ref 98–111)
Creatinine, Ser: 1.64 mg/dL — ABNORMAL HIGH (ref 0.44–1.00)
GFR calc Af Amer: 45 mL/min — ABNORMAL LOW (ref >60)
GFR calc non Af Amer: 39 mL/min — ABNORMAL LOW (ref >60)
Glucose, Bld: 89 mg/dL (ref 70–99)
Potassium: 3.8 mmol/L (ref 3.5–5.1)
Sodium: 136 mmol/L (ref 135–145)
Total Bilirubin: 1.4 mg/dL — ABNORMAL HIGH (ref 0.3–1.2)
Total Protein: 7 g/dL (ref 6.5–8.1)

## 2019-11-18 LAB — RESPIRATORY PANEL BY RT PCR (FLU A&B, COVID)
Influenza A by PCR: NEGATIVE
Influenza B by PCR: NEGATIVE
SARS Coronavirus 2 by RT PCR: NEGATIVE

## 2019-11-18 LAB — RPR: RPR Ser Ql: NONREACTIVE

## 2019-11-18 LAB — ABO/RH: ABO/RH(D): B POS

## 2019-11-18 LAB — URIC ACID: Uric Acid, Serum: 6.1 mg/dL (ref 2.5–7.1)

## 2019-11-18 SURGERY — Surgical Case
Anesthesia: Epidural | Wound class: Clean Contaminated

## 2019-11-18 MED ORDER — MEPERIDINE HCL 25 MG/ML IJ SOLN
INTRAMUSCULAR | Status: AC
  Filled 2019-11-18: qty 1

## 2019-11-18 MED ORDER — KETOROLAC TROMETHAMINE 30 MG/ML IJ SOLN
30.0000 mg | Freq: Four times a day (QID) | INTRAMUSCULAR | Status: DC
  Administered 2019-11-19 (×2): 30 mg via INTRAVENOUS
  Filled 2019-11-18 (×2): qty 1

## 2019-11-18 MED ORDER — SODIUM CHLORIDE 0.9% FLUSH: 3.0000 mL | INTRAVENOUS | Status: DC | PRN

## 2019-11-18 MED ORDER — ONDANSETRON HCL 4 MG/2ML IJ SOLN: 4.0000 mg | Freq: Three times a day (TID) | INTRAMUSCULAR | Status: DC | PRN

## 2019-11-18 MED ORDER — CHLOROPROCAINE HCL (PF) 3 % IJ SOLN
INTRAMUSCULAR | Status: DC | PRN
  Administered 2019-11-18: 20 mL

## 2019-11-18 MED ORDER — ONDANSETRON HCL 4 MG/2ML IJ SOLN: 4.0000 mg | Freq: Four times a day (QID) | INTRAMUSCULAR | Status: DC | PRN

## 2019-11-18 MED ORDER — MEPERIDINE HCL 25 MG/ML IJ SOLN: 6.2500 mg | INTRAMUSCULAR | Status: DC | PRN

## 2019-11-18 MED ORDER — KETOROLAC TROMETHAMINE 30 MG/ML IJ SOLN
30.0000 mg | Freq: Once | INTRAMUSCULAR | Status: AC | PRN
  Administered 2019-11-18: 30 mg via INTRAVENOUS

## 2019-11-18 MED ORDER — ACETAMINOPHEN 500 MG PO TABS
1000.0000 mg | ORAL_TABLET | Freq: Four times a day (QID) | ORAL | Status: AC
  Administered 2019-11-18 – 2019-11-19 (×3): 1000 mg via ORAL
  Filled 2019-11-18 (×3): qty 2

## 2019-11-18 MED ORDER — LIDOCAINE HCL (PF) 1 % IJ SOLN: 30.0000 mL | INTRAMUSCULAR | Status: DC | PRN

## 2019-11-18 MED ORDER — PHENYLEPHRINE 40 MCG/ML (10ML) SYRINGE FOR IV PUSH (FOR BLOOD PRESSURE SUPPORT): 80.0000 ug | PREFILLED_SYRINGE | INTRAVENOUS | Status: DC | PRN

## 2019-11-18 MED ORDER — LIDOCAINE-EPINEPHRINE (PF) 2 %-1:200000 IJ SOLN
INTRAMUSCULAR | Status: AC
  Filled 2019-11-18: qty 10

## 2019-11-18 MED ORDER — SODIUM CHLORIDE 0.9 % IR SOLN
Status: DC | PRN
  Administered 2019-11-18 (×2): 1

## 2019-11-18 MED ORDER — OXYTOCIN 40 UNITS IN NORMAL SALINE INFUSION - SIMPLE MED
INTRAVENOUS | Status: DC | PRN
  Administered 2019-11-18: 40 [IU] via INTRAVENOUS

## 2019-11-18 MED ORDER — LIDOCAINE-EPINEPHRINE (PF) 2 %-1:200000 IJ SOLN
INTRAMUSCULAR | Status: DC | PRN
  Administered 2019-11-18: 3 mL via EPIDURAL
  Administered 2019-11-18 (×2): 5 mL via EPIDURAL

## 2019-11-18 MED ORDER — NALBUPHINE HCL 10 MG/ML IJ SOLN: 5.0000 mg | Freq: Once | INTRAMUSCULAR | Status: DC | PRN

## 2019-11-18 MED ORDER — PHENYLEPHRINE 40 MCG/ML (10ML) SYRINGE FOR IV PUSH (FOR BLOOD PRESSURE SUPPORT)
PREFILLED_SYRINGE | INTRAVENOUS | Status: AC
  Filled 2019-11-18: qty 10

## 2019-11-18 MED ORDER — ACETAMINOPHEN 325 MG PO TABS: 650.0000 mg | ORAL_TABLET | ORAL | Status: DC | PRN

## 2019-11-18 MED ORDER — DIPHENHYDRAMINE HCL 50 MG/ML IJ SOLN: 12.5000 mg | INTRAMUSCULAR | Status: DC | PRN

## 2019-11-18 MED ORDER — FENTANYL CITRATE (PF) 100 MCG/2ML IJ SOLN: 100.0000 ug | INTRAMUSCULAR | Status: DC | PRN

## 2019-11-18 MED ORDER — MEPERIDINE HCL 25 MG/ML IJ SOLN
INTRAMUSCULAR | Status: DC | PRN
  Administered 2019-11-18 (×2): 12.5 mg via INTRAVENOUS

## 2019-11-18 MED ORDER — EPHEDRINE 5 MG/ML INJ: 10.0000 mg | INTRAVENOUS | Status: DC | PRN

## 2019-11-18 MED ORDER — SIMETHICONE 80 MG PO CHEW
80.0000 mg | CHEWABLE_TABLET | Freq: Three times a day (TID) | ORAL | Status: DC
  Administered 2019-11-19 – 2019-11-21 (×7): 80 mg via ORAL
  Filled 2019-11-18 (×7): qty 1

## 2019-11-18 MED ORDER — DIBUCAINE (PERIANAL) 1 % EX OINT: 1.0000 "application " | TOPICAL_OINTMENT | CUTANEOUS | Status: DC | PRN

## 2019-11-18 MED ORDER — ONDANSETRON HCL 4 MG/2ML IJ SOLN
INTRAMUSCULAR | Status: DC | PRN
  Administered 2019-11-18: 4 mg via INTRAVENOUS

## 2019-11-18 MED ORDER — CHLOROPROCAINE HCL (PF) 3 % IJ SOLN
INTRAMUSCULAR | Status: AC
  Filled 2019-11-18: qty 20

## 2019-11-18 MED ORDER — SOD CITRATE-CITRIC ACID 500-334 MG/5ML PO SOLN
30.0000 mL | ORAL | Status: DC | PRN
  Administered 2019-11-18: 30 mL via ORAL
  Filled 2019-11-18: qty 30

## 2019-11-18 MED ORDER — WITCH HAZEL-GLYCERIN EX PADS: 1.0000 "application " | MEDICATED_PAD | CUTANEOUS | Status: DC | PRN

## 2019-11-18 MED ORDER — FENTANYL-BUPIVACAINE-NACL 0.5-0.125-0.9 MG/250ML-% EP SOLN
12.0000 mL/h | EPIDURAL | Status: DC | PRN
  Filled 2019-11-18: qty 250

## 2019-11-18 MED ORDER — IBUPROFEN 800 MG PO TABS: 800.0000 mg | ORAL_TABLET | Freq: Four times a day (QID) | ORAL | Status: DC

## 2019-11-18 MED ORDER — DEXAMETHASONE SODIUM PHOSPHATE 4 MG/ML IJ SOLN
INTRAMUSCULAR | Status: DC | PRN
  Administered 2019-11-18: 4 mg via INTRAVENOUS

## 2019-11-18 MED ORDER — SCOPOLAMINE 1 MG/3DAYS TD PT72
1.0000 | MEDICATED_PATCH | Freq: Once | TRANSDERMAL | Status: DC
  Administered 2019-11-18: 1.5 mg via TRANSDERMAL

## 2019-11-18 MED ORDER — NALBUPHINE HCL 10 MG/ML IJ SOLN: 5.0000 mg | INTRAMUSCULAR | Status: DC | PRN

## 2019-11-18 MED ORDER — DEXAMETHASONE SODIUM PHOSPHATE 4 MG/ML IJ SOLN
INTRAMUSCULAR | Status: AC
  Filled 2019-11-18: qty 1

## 2019-11-18 MED ORDER — KETOROLAC TROMETHAMINE 30 MG/ML IJ SOLN: 30.0000 mg | Freq: Four times a day (QID) | INTRAMUSCULAR | Status: DC | PRN

## 2019-11-18 MED ORDER — PHENYLEPHRINE HCL (PRESSORS) 10 MG/ML IV SOLN
INTRAVENOUS | Status: DC | PRN
  Administered 2019-11-18 (×3): 80 ug via INTRAVENOUS

## 2019-11-18 MED ORDER — LACTATED RINGERS IV SOLN
500.0000 mL | Freq: Once | INTRAVENOUS | Status: AC
  Administered 2019-11-18: 500 mL via INTRAVENOUS

## 2019-11-18 MED ORDER — LIDOCAINE HCL (PF) 1 % IJ SOLN
INTRAMUSCULAR | Status: DC | PRN
  Administered 2019-11-18: 6 mL via EPIDURAL

## 2019-11-18 MED ORDER — PRENATAL MULTIVITAMIN CH
1.0000 | ORAL_TABLET | Freq: Every day | ORAL | Status: DC
  Administered 2019-11-19 – 2019-11-21 (×3): 1 via ORAL
  Filled 2019-11-18 (×3): qty 1

## 2019-11-18 MED ORDER — OXYTOCIN 40 UNITS IN NORMAL SALINE INFUSION - SIMPLE MED: 2.5000 [IU]/h | INTRAVENOUS | Status: AC

## 2019-11-18 MED ORDER — OXYTOCIN BOLUS FROM INFUSION: 500.0000 mL | Freq: Once | INTRAVENOUS | Status: DC

## 2019-11-18 MED ORDER — OXYTOCIN 40 UNITS IN NORMAL SALINE INFUSION - SIMPLE MED
1.0000 m[IU]/min | INTRAVENOUS | Status: DC
  Administered 2019-11-18: 2 m[IU]/min via INTRAVENOUS
  Filled 2019-11-18: qty 1000

## 2019-11-18 MED ORDER — BUPIVACAINE HCL (PF) 0.25 % IJ SOLN
INTRAMUSCULAR | Status: AC
  Filled 2019-11-18: qty 30

## 2019-11-18 MED ORDER — OXYTOCIN 40 UNITS IN NORMAL SALINE INFUSION - SIMPLE MED: 2.5000 [IU]/h | INTRAVENOUS | Status: DC

## 2019-11-18 MED ORDER — SODIUM CHLORIDE (PF) 0.9 % IJ SOLN
INTRAMUSCULAR | Status: DC | PRN
  Administered 2019-11-18: 12 mL/h via EPIDURAL

## 2019-11-18 MED ORDER — DIPHENHYDRAMINE HCL 25 MG PO CAPS: 25.0000 mg | ORAL_CAPSULE | Freq: Four times a day (QID) | ORAL | Status: DC | PRN

## 2019-11-18 MED ORDER — SENNOSIDES-DOCUSATE SODIUM 8.6-50 MG PO TABS
2.0000 | ORAL_TABLET | ORAL | Status: DC
  Administered 2019-11-18 – 2019-11-20 (×3): 2 via ORAL
  Filled 2019-11-18 (×3): qty 2

## 2019-11-18 MED ORDER — FENTANYL CITRATE (PF) 100 MCG/2ML IJ SOLN
INTRAMUSCULAR | Status: AC
  Administered 2019-11-18: 100 ug via INTRAVENOUS
  Filled 2019-11-18: qty 2

## 2019-11-18 MED ORDER — ONDANSETRON HCL 4 MG/2ML IJ SOLN
INTRAMUSCULAR | Status: AC
  Filled 2019-11-18: qty 2

## 2019-11-18 MED ORDER — NALOXONE HCL 4 MG/10ML IJ SOLN
1.0000 ug/kg/h | INTRAVENOUS | Status: DC | PRN
  Filled 2019-11-18: qty 5

## 2019-11-18 MED ORDER — MORPHINE SULFATE (PF) 0.5 MG/ML IJ SOLN
INTRAMUSCULAR | Status: AC
  Filled 2019-11-18: qty 10

## 2019-11-18 MED ORDER — FENTANYL-BUPIVACAINE-NACL 0.5-0.125-0.9 MG/250ML-% EP SOLN: 12.0000 mL/h | EPIDURAL | Status: DC | PRN

## 2019-11-18 MED ORDER — MORPHINE SULFATE (PF) 0.5 MG/ML IJ SOLN
INTRAMUSCULAR | Status: DC | PRN
  Administered 2019-11-18: 3 mg via EPIDURAL

## 2019-11-18 MED ORDER — HYDROMORPHONE HCL 1 MG/ML IJ SOLN: 0.2500 mg | INTRAMUSCULAR | Status: DC | PRN

## 2019-11-18 MED ORDER — LACTATED RINGERS IV SOLN: 500.0000 mL | INTRAVENOUS | Status: DC | PRN

## 2019-11-18 MED ORDER — OXYCODONE HCL 5 MG PO TABS: 5.0000 mg | ORAL_TABLET | ORAL | Status: DC | PRN

## 2019-11-18 MED ORDER — SIMETHICONE 80 MG PO CHEW: 80.0000 mg | CHEWABLE_TABLET | ORAL | Status: DC | PRN

## 2019-11-18 MED ORDER — SCOPOLAMINE 1 MG/3DAYS TD PT72
MEDICATED_PATCH | TRANSDERMAL | Status: AC
  Filled 2019-11-18: qty 1

## 2019-11-18 MED ORDER — NALBUPHINE HCL 10 MG/ML IJ SOLN
5.0000 mg | INTRAMUSCULAR | Status: DC | PRN
  Administered 2019-11-19 (×3): 5 mg via INTRAVENOUS
  Filled 2019-11-18 (×2): qty 1

## 2019-11-18 MED ORDER — OXYCODONE-ACETAMINOPHEN 5-325 MG PO TABS: 1.0000 | ORAL_TABLET | ORAL | Status: DC | PRN

## 2019-11-18 MED ORDER — ZOLPIDEM TARTRATE 5 MG PO TABS: 5.0000 mg | ORAL_TABLET | Freq: Every evening | ORAL | Status: DC | PRN

## 2019-11-18 MED ORDER — OXYTOCIN 10 UNIT/ML IJ SOLN: 10.0000 [IU] | Freq: Once | INTRAMUSCULAR | Status: DC

## 2019-11-18 MED ORDER — COCONUT OIL OIL: 1.0000 "application " | TOPICAL_OIL | Status: DC | PRN

## 2019-11-18 MED ORDER — MENTHOL 3 MG MT LOZG: 1.0000 | LOZENGE | OROMUCOSAL | Status: DC | PRN

## 2019-11-18 MED ORDER — LACTATED RINGERS IV SOLN: INTRAVENOUS | Status: DC

## 2019-11-18 MED ORDER — SODIUM CHLORIDE 0.9 % IV SOLN: INTRAVENOUS | Status: DC | PRN

## 2019-11-18 MED ORDER — PROMETHAZINE HCL 25 MG/ML IJ SOLN: 6.2500 mg | INTRAMUSCULAR | Status: DC | PRN

## 2019-11-18 MED ORDER — BUPIVACAINE HCL (PF) 0.25 % IJ SOLN
INTRAMUSCULAR | Status: DC | PRN
  Administered 2019-11-18: 30 mL

## 2019-11-18 MED ORDER — OXYTOCIN 40 UNITS IN NORMAL SALINE INFUSION - SIMPLE MED
INTRAVENOUS | Status: AC
  Filled 2019-11-18: qty 1000

## 2019-11-18 MED ORDER — CEFAZOLIN SODIUM-DEXTROSE 2-4 GM/100ML-% IV SOLN
2.0000 g | Freq: Once | INTRAVENOUS | Status: AC
  Administered 2019-11-18: 20:00:00 2 g via INTRAVENOUS

## 2019-11-18 MED ORDER — LACTATED RINGERS IV SOLN: INTRAVENOUS | Status: DC | PRN

## 2019-11-18 MED ORDER — NALBUPHINE HCL 10 MG/ML IJ SOLN
5.0000 mg | Freq: Once | INTRAMUSCULAR | Status: DC | PRN
  Filled 2019-11-18: qty 1

## 2019-11-18 MED ORDER — TERBUTALINE SULFATE 1 MG/ML IJ SOLN: 0.2500 mg | Freq: Once | INTRAMUSCULAR | Status: DC | PRN

## 2019-11-18 MED ORDER — NALOXONE HCL 0.4 MG/ML IJ SOLN: 0.4000 mg | INTRAMUSCULAR | Status: DC | PRN

## 2019-11-18 MED ORDER — CHLOROPROCAINE HCL (PF) 3 % IJ SOLN
INTRAMUSCULAR | Status: DC | PRN
  Administered 2019-11-18: 30 mL

## 2019-11-18 MED ORDER — KETOROLAC TROMETHAMINE 30 MG/ML IJ SOLN
INTRAMUSCULAR | Status: AC
  Filled 2019-11-18: qty 1

## 2019-11-18 MED ORDER — SIMETHICONE 80 MG PO CHEW
80.0000 mg | CHEWABLE_TABLET | ORAL | Status: DC
  Administered 2019-11-18 – 2019-11-20 (×3): 80 mg via ORAL
  Filled 2019-11-18 (×3): qty 1

## 2019-11-18 MED ORDER — OXYCODONE-ACETAMINOPHEN 5-325 MG PO TABS: 2.0000 | ORAL_TABLET | ORAL | Status: DC | PRN

## 2019-11-18 MED ORDER — DIPHENHYDRAMINE HCL 25 MG PO CAPS: 25.0000 mg | ORAL_CAPSULE | ORAL | Status: DC | PRN

## 2019-11-18 SURGICAL SUPPLY — 48 items
APL SKNCLS STERI-STRIP NONHPOA (GAUZE/BANDAGES/DRESSINGS) ×1
BARRIER ADHS 3X4 INTERCEED (GAUZE/BANDAGES/DRESSINGS) ×3 IMPLANT
BENZOIN TINCTURE PRP APPL 2/3 (GAUZE/BANDAGES/DRESSINGS) ×2 IMPLANT
BRR ADH 4X3 ABS CNTRL BYND (GAUZE/BANDAGES/DRESSINGS) ×1
CHLORAPREP W/TINT 26ML (MISCELLANEOUS) ×3 IMPLANT
CLAMP CORD UMBIL (MISCELLANEOUS) IMPLANT
CLOSURE WOUND 1/2 X4 (GAUZE/BANDAGES/DRESSINGS) ×1
CLOTH BEACON ORANGE TIMEOUT ST (SAFETY) ×3 IMPLANT
DECANTER SPIKE VIAL GLASS SM (MISCELLANEOUS) ×6 IMPLANT
DRAPE C SECTION CLR SCREEN (DRAPES) ×3 IMPLANT
DRSG OPSITE POSTOP 4X10 (GAUZE/BANDAGES/DRESSINGS) ×3 IMPLANT
ELECT REM PT RETURN 9FT ADLT (ELECTROSURGICAL) ×3
ELECTRODE REM PT RTRN 9FT ADLT (ELECTROSURGICAL) ×1 IMPLANT
EXTRACTOR VACUUM M CUP 4 TUBE (SUCTIONS) IMPLANT
EXTRACTOR VACUUM M CUP 4' TUBE (SUCTIONS)
GLOVE BIOGEL PI IND STRL 7.0 (GLOVE) ×2 IMPLANT
GLOVE BIOGEL PI INDICATOR 7.0 (GLOVE) ×4
GLOVE ECLIPSE 6.5 STRL STRAW (GLOVE) ×3 IMPLANT
GOWN STRL REUS W/TWL LRG LVL3 (GOWN DISPOSABLE) ×6 IMPLANT
KIT ABG SYR 3ML LUER SLIP (SYRINGE) IMPLANT
NDL HYPO 25X5/8 SAFETYGLIDE (NEEDLE) IMPLANT
NEEDLE HYPO 22GX1.5 SAFETY (NEEDLE) ×9 IMPLANT
NEEDLE HYPO 25X5/8 SAFETYGLIDE (NEEDLE) IMPLANT
NS IRRIG 1000ML POUR BTL (IV SOLUTION) ×3 IMPLANT
PACK C SECTION WH (CUSTOM PROCEDURE TRAY) ×3 IMPLANT
PAD OB MATERNITY 4.3X12.25 (PERSONAL CARE ITEMS) ×3 IMPLANT
RTRCTR C-SECT PINK 25CM LRG (MISCELLANEOUS) IMPLANT
STRIP CLOSURE SKIN 1/2X4 (GAUZE/BANDAGES/DRESSINGS) ×1 IMPLANT
SUT CHROMIC GUT AB #0 18 (SUTURE) IMPLANT
SUT MNCRL 0 VIOLET CTX 36 (SUTURE) ×3 IMPLANT
SUT MON AB 2-0 SH 27 (SUTURE)
SUT MON AB 2-0 SH27 (SUTURE) IMPLANT
SUT MON AB 3-0 SH 27 (SUTURE)
SUT MON AB 3-0 SH27 (SUTURE) IMPLANT
SUT MON AB 4-0 PS1 27 (SUTURE) IMPLANT
SUT MONOCRYL 0 CTX 36 (SUTURE) ×9
SUT PLAIN 2 0 (SUTURE)
SUT PLAIN 2 0 XLH (SUTURE) IMPLANT
SUT PLAIN ABS 2-0 CT1 27XMFL (SUTURE) IMPLANT
SUT VIC AB 0 CT1 36 (SUTURE) ×6 IMPLANT
SUT VIC AB 2-0 CT1 27 (SUTURE) ×3
SUT VIC AB 2-0 CT1 TAPERPNT 27 (SUTURE) ×1 IMPLANT
SUT VIC AB 4-0 KS 27 (SUTURE) ×2 IMPLANT
SUT VIC AB 4-0 PS2 27 (SUTURE) IMPLANT
SYR CONTROL 10ML LL (SYRINGE) ×5 IMPLANT
TOWEL OR 17X24 6PK STRL BLUE (TOWEL DISPOSABLE) ×3 IMPLANT
TRAY FOLEY W/BAG SLVR 14FR LF (SET/KITS/TRAYS/PACK) IMPLANT
WATER STERILE IRR 1000ML POUR (IV SOLUTION) ×5 IMPLANT

## 2019-11-18 NOTE — Transfer of Care (Signed)
Immediate Anesthesia Transfer of Care Note  Patient: Sabrina Richardson  Procedure(s) Performed: CESAREAN SECTION (N/A )  Patient Location: PACU  Anesthesia Type:Epidural  Level of Consciousness: awake, alert  and patient cooperative  Airway & Oxygen Therapy: Patient Spontanous Breathing  Post-op Assessment: Report given to RN and Post -op Vital signs reviewed and stable  Post vital signs: Reviewed and stable  Last Vitals:  Vitals Value Taken Time  BP 109/84 11/18/19 2047  Temp    Pulse 102 11/18/19 2051  Resp 19 11/18/19 2051  SpO2 100 % 11/18/19 2051  Vitals shown include unvalidated device data.  Last Pain:  Vitals:   11/18/19 1846  TempSrc: Axillary  PainSc:          Complications: No apparent anesthesia complications

## 2019-11-18 NOTE — Progress Notes (Addendum)
S:  comfortable with epidural. No complaints.  O:  VS: Blood pressure (!) 103/56, pulse 81, temperature 98.9 F (37.2 C), temperature source Oral, resp. rate 16, height 5' (1.524 m), weight 72.7 kg, last menstrual period 02/17/2019, SpO2 99 %.  Patient Vitals for the past 24 hrs:  BP Temp Temp src Pulse Resp SpO2 Height Weight  11/18/19 1231 (!) 103/56 -- -- 81 16 -- -- --  11/18/19 1201 111/63 -- -- 84 14 -- -- --  11/18/19 1140 116/62 -- -- 81 14 99 % -- --  11/18/19 1136 112/66 -- -- 85 16 -- -- --  11/18/19 1131 110/65 -- -- 90 14 -- -- --  11/18/19 1126 118/67 -- -- 88 14 -- -- --  11/18/19 1121 112/89 -- -- (!) 114 14 -- -- --  11/18/19 1116 (!) 150/89 -- -- 98 18 -- -- --  11/18/19 1111 117/64 -- -- 96 20 98 % -- --  11/18/19 0957 (!) 143/76 98.9 F (37.2 C) Oral (!) 105 18 -- 5' (1.524 m) 72.7 kg  11/18/19 0730 (!) 143/85 -- -- (!) 113 -- -- -- --  11/18/19 0716 130/76 -- -- 97 -- -- -- --  11/18/19 0700 139/89 -- -- (!) 113 -- -- -- --  11/18/19 0658 131/82 -- -- (!) 105 -- -- -- --  11/18/19 0657 (!) 144/94 -- -- (!) 107 -- -- -- --  11/18/19 0639 123/76 98.4 F (36.9 C) Oral (!) 106 20 -- 5' (1.524 m) 72.7 kg   Results for orders placed or performed during the hospital encounter of 11/18/19 (from the past 24 hour(s))  Respiratory Panel by RT PCR (Flu A&B, Covid) - Nasopharyngeal Swab     Status: None   Collection Time: 11/18/19  8:57 AM   Specimen: Nasopharyngeal Swab  Result Value Ref Range   SARS Coronavirus 2 by RT PCR NEGATIVE NEGATIVE   Influenza A by PCR NEGATIVE NEGATIVE   Influenza B by PCR NEGATIVE NEGATIVE  Comprehensive metabolic panel     Status: Abnormal   Collection Time: 11/18/19  9:27 AM  Result Value Ref Range   Sodium 136 135 - 145 mmol/L   Potassium 3.8 3.5 - 5.1 mmol/L   Chloride 101 98 - 111 mmol/L   CO2 18 (L) 22 - 32 mmol/L   Glucose, Bld 89 70 - 99 mg/dL   BUN 20 6 - 20 mg/dL   Creatinine, Ser 1.64 (H) 0.44 - 1.00 mg/dL   Calcium 9.2 8.9 -  10.3 mg/dL   Total Protein 7.0 6.5 - 8.1 g/dL   Albumin 2.6 (L) 3.5 - 5.0 g/dL   AST 52 (H) 15 - 41 U/L   ALT 33 0 - 44 U/L   Alkaline Phosphatase 199 (H) 38 - 126 U/L   Total Bilirubin 1.4 (H) 0.3 - 1.2 mg/dL   GFR calc non Af Amer 39 (L) >60 mL/min   GFR calc Af Amer 45 (L) >60 mL/min   Anion gap 17 (H) 5 - 15  Uric acid     Status: None   Collection Time: 11/18/19  9:27 AM  Result Value Ref Range   Uric Acid, Serum 6.1 2.5 - 7.1 mg/dL  CBC with Differential     Status: Abnormal   Collection Time: 11/18/19  9:27 AM  Result Value Ref Range   WBC 10.3 4.0 - 10.5 K/uL   RBC 4.18 3.87 - 5.11 MIL/uL   Hemoglobin 13.2 12.0 - 15.0 g/dL  HCT 39.2 36.0 - 46.0 %   MCV 93.8 80.0 - 100.0 fL   MCH 31.6 26.0 - 34.0 pg   MCHC 33.7 30.0 - 36.0 g/dL   RDW 31.4 97.0 - 26.3 %   Platelets 239 150 - 400 K/uL   nRBC 0.0 0.0 - 0.2 %   Neutrophils Relative % 80 %   Neutro Abs 8.2 (H) 1.7 - 7.7 K/uL   Lymphocytes Relative 9 %   Lymphs Abs 1.0 0.7 - 4.0 K/uL   Monocytes Relative 10 %   Monocytes Absolute 1.0 0.1 - 1.0 K/uL   Eosinophils Relative 0 %   Eosinophils Absolute 0.0 0.0 - 0.5 K/uL   Basophils Relative 0 %   Basophils Absolute 0.0 0.0 - 0.1 K/uL   Immature Granulocytes 1 %   Abs Immature Granulocytes 0.13 (H) 0.00 - 0.07 K/uL  RPR     Status: None   Collection Time: 11/18/19  9:27 AM  Result Value Ref Range   RPR Ser Ql NON REACTIVE NON REACTIVE  Type and screen [facility]     Status: None   Collection Time: 11/18/19  9:30 AM  Result Value Ref Range   ABO/RH(D) B POS    Antibody Screen NEG    Sample Expiration      11/21/2019,2359 Performed at [facility], [address], [place], [place] [address]   ABO/Rh     Status: None   Collection Time: 11/18/19  9:30 AM  Result Value Ref Range   ABO/RH(D)      B POS Performed at [facility], [address] [place], [place] [address]   Protein / creatinine ratio, urine     Status: None   Collection  Time: 11/18/19 10:51 AM  Result Value Ref Range   Creatinine, Urine 268.24 mg/dL   Total Protein, Urine 36 mg/dL   Protein Creatinine Ratio 0.13 0.00 - 0.15 mg/mg[Cre]          FHR : baseline 135 bpm/ variability moderate / accelerations +15x15 / no decelerations        Toco: contractions every 2-6 minutes / moderate        Cervix : Dilation: 5.5 Effacement (%): 100 Cervical Position: Middle Station: 0 Presentation: Vertex Exam by:: M. Rema Lievanos, CNM       LOA position        Membranes: ruptured membranes during VE - clear fluid  A: Latent labor     FHR category 1    GBS Negative      Gestational Hypertension with Elevated serum creatinine and AST  P: Continue Pitocin augmentation      High fowlers position      Anticipated MOD: working toward NSVD    Carlean Jews, MSN, CNM [facility]

## 2019-11-18 NOTE — H&P (Signed)
Sabrina Richardson is a 39 y.o. female presenting at term in labor. Intact membrane. GBS cx neg. AMA OB History    Gravida  1   Para      Term      Preterm      AB      Living        SAB      TAB      Ectopic      Multiple      Live Births             Past Medical History:  Diagnosis Date  . Chlamydia contact, treated 2009  . Migraines 2007   per pt none now  . PCOS (polycystic ovarian syndrome)    Past Surgical History:  Procedure Laterality Date  . NO PAST SURGERIES     Family History: family history includes Diabetes in her maternal grandmother, mother, and paternal grandfather; Hyperlipidemia in her father; Hypertension in her father. Social History:  reports that she has never smoked. She has never used smokeless tobacco. She reports that she does not drink alcohol or use drugs.     Maternal Diabetes: No Genetic Screening: Normal Maternal Ultrasounds/Referrals: Normal Fetal Ultrasounds or other Referrals:  None Maternal Substance Abuse:  No Significant Maternal Medications:  None Significant Maternal Lab Results:  Group B Strep negative Other Comments:  None  Review of Systems  All other systems reviewed and are negative.  History Dilation: 4.5 Effacement (%): 80 Station: -1 Exam by:: Polos RN Blood pressure (!) 143/85, pulse (!) 113, temperature 98.4 F (36.9 C), temperature source Oral, resp. rate 20, height 5' (1.524 m), weight 72.7 kg, last menstrual period 02/17/2019. Exam Physical Exam  Constitutional: She is oriented to person, place, and time. She appears well-developed and well-nourished.  HENT:  Head: Atraumatic.  Eyes: EOM are normal.  Cardiovascular: Regular rhythm.  Respiratory: Effort normal.  GI: Soft.  Musculoskeletal:        General: Edema present.     Cervical back: Neck supple.  Neurological: She is alert and oriented to person, place, and time.  Skin: Skin is warm and dry.    Prenatal labs: ABO, Rh: B/Positive/--  (07/31 1623) Antibody:  neg Rubella:  Immune RPR:   NR HBsAg:   neg HIV:   Nr GBS:   neg  Assessment/Plan: Labor Term  gestational HTN P) admit routine lab. PIH labs. Epidural. Pitocin augmentation prn   Garet Hooton A Sihaam Chrobak 11/18/2019, 8:53 AM

## 2019-11-18 NOTE — Brief Op Note (Signed)
11/18/2019  8:43 PM  PATIENT:  Sabrina Richardson  39 y.o. female  PRE-OPERATIVE DIAGNOSIS:  arrest of dilation, term   POST-OPERATIVE DIAGNOSIS:   arrest of dilation, term,   PROCEDURE:  Primary Cesarean section, kerr hysterotomy  SURGEON:  Surgeon(s) and Role:    * Maxie Better, MD - Primary  PHYSICIAN ASSISTANT:   ASSISTANTS: Carlean Jews, CNM   ANESTHESIA:   epidural FINDINGS; LIVE FEMALE OP,  loop of cord next to chest, nl tubes and ovaries, very concentrated urine with heme EBL:  100 mL   BLOOD ADMINISTERED:none  DRAINS: none   LOCAL MEDICATIONS USED:  MARCAINE     SPECIMEN:  No Specimen  DISPOSITION OF SPECIMEN:  N/A  COUNTS:  YES  TOURNIQUET:  * No tourniquets in log *  DICTATION: .Other Dictation: Dictation Number [id number]  PLAN OF CARE: Admit to inpatient   PATIENT DISPOSITION:  PACU - hemodynamically stable.   Delay start of Pharmacological VTE agent (>24hrs) due to surgical blood loss or risk of bleeding: no

## 2019-11-18 NOTE — Anesthesia Procedure Notes (Signed)
Epidural Patient location during procedure: OB Start time: 11/18/2019 11:11 AM End time: 11/18/2019 11:17 AM  Staffing Anesthesiologist: Bethena Midget, MD  Preanesthetic Checklist Completed: patient identified, IV checked, site marked, risks and benefits discussed, surgical consent, monitors and equipment checked, pre-op evaluation and timeout performed  Epidural Patient position: sitting Prep: DuraPrep and site prepped and draped Patient monitoring: continuous pulse ox and blood pressure Approach: midline Location: L3-L4 Injection technique: LOR air  Needle:  Needle type: Tuohy  Needle gauge: 17 G Needle length: 9 cm and 9 Needle insertion depth: 6 cm Catheter type: closed end flexible Catheter size: 19 Gauge Catheter at skin depth: 11 cm Test dose: negative  Assessment Events: blood not aspirated, injection not painful, no injection resistance, no paresthesia and negative IV test

## 2019-11-18 NOTE — Anesthesia Preprocedure Evaluation (Signed)
Anesthesia Evaluation  Patient identified by MRN, date of birth, ID band Patient awake    Reviewed: Allergy & Precautions, H&P , NPO status , Patient's Chart, lab work & pertinent test results, reviewed documented beta blocker date and time   Airway Mallampati: II  TM Distance: >3 FB Neck ROM: full    Dental no notable dental hx.    Pulmonary neg pulmonary ROS,    Pulmonary exam normal breath sounds clear to auscultation       Cardiovascular hypertension, Normal cardiovascular exam Rhythm:regular Rate:Normal     Neuro/Psych negative neurological ROS  negative psych ROS   GI/Hepatic negative GI ROS, Neg liver ROS,   Endo/Other  negative endocrine ROS  Renal/GU negative Renal ROS  negative genitourinary   Musculoskeletal   Abdominal (+) + obese,   Peds  Hematology negative hematology ROS (+)   Anesthesia Other Findings   Reproductive/Obstetrics (+) Pregnancy                             Anesthesia Physical Anesthesia Plan  ASA: III  Anesthesia Plan: Epidural   Post-op Pain Management:    Induction:   PONV Risk Score and Plan:   Airway Management Planned:   Additional Equipment:   Intra-op Plan:   Post-operative Plan:   Informed Consent: I have reviewed the patients History and Physical, chart, labs and discussed the procedure including the risks, benefits and alternatives for the proposed anesthesia with the patient or authorized representative who has indicated his/her understanding and acceptance.       Plan Discussed with:   Anesthesia Plan Comments:         Anesthesia Quick Evaluation

## 2019-11-18 NOTE — Progress Notes (Signed)
S:  No complaints. Comfortable with epidural. On left side with peanut.   O:  Pitocin at 8 milliunits  VS: Blood pressure (!) 96/59, pulse 99, temperature 98.8 F (37.1 C), temperature source Oral, resp. rate 16, height 5' (1.524 m), weight 72.7 kg, last menstrual period 02/17/2019, SpO2 99 %.        FHR : baseline 140 bpm / variability  moderate / accelerations + / no decelerations        Toco: contractions every 1-2 minutes / moderate         Cervix : Dilation: 9 Effacement (%): 100 Cervical Position: Middle Station: 0, Plus 1 Presentation: Vertex Exam by:: Joelene Millin, CNM        Membranes: clear fluid  A: Active labor     FHR category 1     GBS negative  P: High fowlers position     Anticipated MOD: NSVD    Carlean Jews, MSN, CNM [facility]

## 2019-11-18 NOTE — MAU Note (Signed)
PT SAYS SHE WAS HERE ON Sunday AM AND Sunday NIGHT - VE  1 CM.  DENIES HSV AND MRSA. GBS- NEG.  UC'S STRONGER.

## 2019-11-19 LAB — CBC
HCT: 31.8 % — ABNORMAL LOW (ref 36.0–46.0)
Hemoglobin: 10.7 g/dL — ABNORMAL LOW (ref 12.0–15.0)
MCH: 32 pg (ref 26.0–34.0)
MCHC: 33.6 g/dL (ref 30.0–36.0)
MCV: 95.2 fL (ref 80.0–100.0)
Platelets: 173 10*3/uL (ref 150–400)
RBC: 3.34 MIL/uL — ABNORMAL LOW (ref 3.87–5.11)
RDW: 14.2 % (ref 11.5–15.5)
WBC: 13.3 10*3/uL — ABNORMAL HIGH (ref 4.0–10.5)
nRBC: 0 % (ref 0.0–0.2)

## 2019-11-19 LAB — CBC WITH DIFFERENTIAL/PLATELET
Abs Immature Granulocytes: 0.22 10*3/uL — ABNORMAL HIGH (ref 0.00–0.07)
Basophils Absolute: 0 10*3/uL (ref 0.0–0.1)
Basophils Relative: 0 %
Eosinophils Absolute: 0 10*3/uL (ref 0.0–0.5)
Eosinophils Relative: 0 %
HCT: 33.7 % — ABNORMAL LOW (ref 36.0–46.0)
Hemoglobin: 11.2 g/dL — ABNORMAL LOW (ref 12.0–15.0)
Immature Granulocytes: 2 %
Lymphocytes Relative: 13 %
Lymphs Abs: 1.4 10*3/uL (ref 0.7–4.0)
MCH: 31.4 pg (ref 26.0–34.0)
MCHC: 33.2 g/dL (ref 30.0–36.0)
MCV: 94.4 fL (ref 80.0–100.0)
Monocytes Absolute: 1.3 10*3/uL — ABNORMAL HIGH (ref 0.1–1.0)
Monocytes Relative: 12 %
Neutro Abs: 8.1 10*3/uL — ABNORMAL HIGH (ref 1.7–7.7)
Neutrophils Relative %: 73 %
Platelets: 173 10*3/uL (ref 150–400)
RBC: 3.57 MIL/uL — ABNORMAL LOW (ref 3.87–5.11)
RDW: 14.2 % (ref 11.5–15.5)
WBC: 11 10*3/uL — ABNORMAL HIGH (ref 4.0–10.5)
nRBC: 0 % (ref 0.0–0.2)

## 2019-11-19 LAB — COMPREHENSIVE METABOLIC PANEL
ALT: 24 U/L (ref 0–44)
AST: 36 U/L (ref 15–41)
Albumin: 1.8 g/dL — ABNORMAL LOW (ref 3.5–5.0)
Alkaline Phosphatase: 122 U/L (ref 38–126)
Anion gap: 7 (ref 5–15)
BUN: 20 mg/dL (ref 6–20)
CO2: 24 mmol/L (ref 22–32)
Calcium: 8.2 mg/dL — ABNORMAL LOW (ref 8.9–10.3)
Chloride: 107 mmol/L (ref 98–111)
Creatinine, Ser: 1.69 mg/dL — ABNORMAL HIGH (ref 0.44–1.00)
GFR calc Af Amer: 44 mL/min — ABNORMAL LOW (ref >60)
GFR calc non Af Amer: 38 mL/min — ABNORMAL LOW (ref >60)
Glucose, Bld: 72 mg/dL (ref 70–99)
Potassium: 3.9 mmol/L (ref 3.5–5.1)
Sodium: 138 mmol/L (ref 135–145)
Total Bilirubin: 0.4 mg/dL (ref 0.3–1.2)
Total Protein: 4.8 g/dL — ABNORMAL LOW (ref 6.5–8.1)

## 2019-11-19 MED ORDER — ACETAMINOPHEN 325 MG PO TABS
650.0000 mg | ORAL_TABLET | ORAL | Status: DC | PRN
  Administered 2019-11-20 – 2019-11-21 (×7): 650 mg via ORAL
  Filled 2019-11-19 (×7): qty 2

## 2019-11-19 NOTE — Progress Notes (Signed)
Subjective: Postpartum Day 1: Cesarean Delivery Patient reports nausea, incisional pain, tolerating PO, + flatus and no problems voiding.    Objective: Vital signs in last 24 hours: Temp:  [98.1 F (36.7 C)-99.8 F (37.7 C)] 98.3 F (36.8 C) (04/07 0330) Pulse Rate:  [72-128] 83 (04/06 2309) Resp:  [9-23] 17 (04/07 0330) BP: (96-150)/(56-89) 125/79 (04/06 2309) SpO2:  [95 %-100 %] 100 % (04/07 0330) Weight:  [72.7 kg] 72.7 kg (04/06 0957)  Physical Exam:  General: alert, cooperative and appears stated age Lochia: appropriate Uterine Fundus: firm Incision: healing well DVT Evaluation: No evidence of DVT seen on physical exam. Negative Homan's sign.  Recent Labs    11/18/19 0927 11/19/19 0625  HGB 13.2 10.7*  HCT 39.2 31.8*    Assessment/Plan: Stable POD 1 PP anemia- asymptomatic Status post Cesarean section. Doing well postoperatively.  Continue current care.  Jaylun Fleener J 11/19/2019, 9:53 AM

## 2019-11-19 NOTE — Lactation Note (Signed)
This note was copied from a baby's chart. Lactation Consultation Note  Patient Name: Sabrina Richardson [id number] Date: 11/19/2019 Reason for consult: Initial assessment;1st time breastfeeding;Term P1, 7 hour  term female infant. Infant had 2 voids and one stool since birth. Per mom, she has DEBP at home. This is infant's fourth time latching at breast. Mom feels infant is latching well she only feels a tug but no pain with latch. Mom latched infant on left breast using cross cradle hold, infant's nose and chin was touching breast, few swallows observed and infant sustained latch and breastfed for 30 minutes. Mom knows to breastfed infant according hunger cues, 8 to 12 times and not exceed 3 hours without breastfeeding infant. Mom knows to ask RN or LC if she has any questions, concerns or need assistance with latching infant at breast.  Reviewed Baby & Me book's Breastfeeding Basics.  Mom made aware of O/P services, breastfeeding support groups, community resources, and our phone # for post-discharge questions.  Maternal Data Formula Feeding for Exclusion: No Has patient been taught Hand Expression?: Yes Does the patient have breastfeeding experience prior to this delivery?: No  Feeding Feeding Type: Breast Fed  LATCH Score Latch: Grasps breast easily, tongue down, lips flanged, rhythmical sucking.  Audible Swallowing: A few with stimulation  Type of Nipple: Everted at rest and after stimulation  Comfort (Breast/Nipple): Soft / non-tender  Hold (Positioning): Assistance needed to correctly position infant at breast and maintain latch.  LATCH Score: 8  Interventions Interventions: Breast feeding basics reviewed;Breast compression;Adjust position;Hand pump;Assisted with latch;Skin to skin;Support pillows;Breast massage;Position options;Hand express;Expressed milk  Lactation Tools Discussed/Used WIC Program: No   Consult Status Consult Status: Follow-up Date: 11/19/19 Follow-up  type: In-patient    Danelle Earthly 11/19/2019, 4:32 AM

## 2019-11-19 NOTE — Anesthesia Postprocedure Evaluation (Signed)
Anesthesia Post Note  Patient: Sabrina Richardson  Procedure(s) Performed: CESAREAN SECTION (N/A )     Patient location during evaluation: Mother Baby Anesthesia Type: Epidural Level of consciousness: awake and alert and oriented Pain management: satisfactory to patient Vital Signs Assessment: post-procedure vital signs reviewed and stable Respiratory status: respiratory function stable Cardiovascular status: stable Postop Assessment: no headache, no backache, epidural receding, patient able to bend at knees, no signs of nausea or vomiting and adequate PO intake Anesthetic complications: no    Last Vitals:  Vitals:   11/18/19 2309 11/19/19 0330  BP: 125/79   Pulse: 83   Resp:  17  Temp:  36.8 C  SpO2:  100%    Last Pain:  Vitals:   11/19/19 0830  TempSrc:   PainSc: (P) 5    Pain Goal:                   Ilay Capshaw

## 2019-11-19 NOTE — Op Note (Signed)
NAMEKATHA, Sabrina Richardson MEDICAL RECORD [id number] ACCOUNT [id number] DATE OF BIRTH:[birth year] FACILITY: [facility] LOCATION: [facility] PHYSICIAN:Akeema Broder A. Elain Wixon, MD  OPERATIVE REPORT  DATE OF PROCEDURE:  11/18/2019  PREOPERATIVE DIAGNOSES:  Arrest of dilatation, term gestation.  PROCEDURE:  Primary cesarean section, Kerr hysterotomy.  POSTOPERATIVE DIAGNOSIS:  Arrest of dilatation, term gestation, occiput posterior presentation.  ANESTHESIA:  Epidural.  SURGEON:  Maxie Better, MD  ASSISTANT:  Sherrilee Gilles, CNM  DESCRIPTION OF PROCEDURE:  Under adequate epidural anesthesia, the patient was placed in the supine position with a left lateral tilt.  An indwelling Foley catheter was in place draining blood tinged urine.  The patient was sterilely prepped and draped  in usual fashion.  Marcaine 0.25% was injected along the planned Pfannenstiel skin incision site.  Pfannenstiel skin incision was then made, carried down to the rectus fascia.  The rectus fascia was opened transversely.  The rectus fascia was then  bluntly and sharply dissected off the rectus muscle in a superior and inferior fashion.  The rectus muscle was split in the midline.  The parietal peritoneum was entered sharply and extended.  A self-retaining Tyriana retractor was then placed.   Vesicouterine peritoneum was opened transversely.  Bladder was bluntly dissected and displaced inferiorly.  A curvilinear low transverse uterine incision was then made and extended in a cephalic and caudad manner using blunt dissection.  In displacing  the shoulder superiorly, a loop of cord was noted adjacent to the body, which was then popped into the field, but was easily pushed back into the uterine cavity.  Subsequent delivery of a live female from an occiput posterior presentation was accomplished.   Baby was bulb suctioned on abdomen.  Delayed cord clamping x1 minute was done.  Cord was clamped, cut.  The baby was transferred to the  awaiting pediatricians who assigned Apgars of 9 and 10 at one and five minutes.  Placenta was manually removed.   Uterine cavity was cleaned of debris.  Uterine incision had no extension.  Uterine incision was closed in 2 layers, the first layer of 0 Monocryl running lock stitch, second layer was imbricating using 0 Vicryl suture.  Abdomen was irrigated, suctioned  of debris.  Normal tubes and large ovaries were noted bilaterally.  The Avi retractor was then removed.  Interceed was placed in an inverted T fashion overlying the incision.  The parietal peritoneum was then closed with 2-0 Vicryl.  The rectus fascia  was closed with 0 Vicryl x2.  The subcutaneous area was irrigated, small bleeders cauterized.  Interrupted 2-0 plain sutures placed and the skin approximated with 4-0 Vicryl subcuticular closure.  Steri-Strips and benzoin was placed.  SPECIMEN:  Placenta, not sent to pathology.  ESTIMATED BLOOD LOSS:  300 mL.  INTRAOPERATIVE FLUIDS:  2700 mL.  URINE OUTPUT:  150 mL.  COUNTS:  Sponge and instrument counts x2 were correct.  COMPLICATIONS:  None.  DISPOSITION:  The patient tolerated the procedure well and was transferred to recovery room in stable condition.  JN/NUANCE  D:11/19/2019 T:11/19/2019 JOB:[id number]

## 2019-11-20 LAB — COMPREHENSIVE METABOLIC PANEL
ALT: 24 U/L (ref 0–44)
AST: 36 U/L (ref 15–41)
Albumin: 1.7 g/dL — ABNORMAL LOW (ref 3.5–5.0)
Alkaline Phosphatase: 104 U/L (ref 38–126)
Anion gap: 7 (ref 5–15)
BUN: 19 mg/dL (ref 6–20)
CO2: 24 mmol/L (ref 22–32)
Calcium: 7.9 mg/dL — ABNORMAL LOW (ref 8.9–10.3)
Chloride: 104 mmol/L (ref 98–111)
Creatinine, Ser: 1.31 mg/dL — ABNORMAL HIGH (ref 0.44–1.00)
GFR calc Af Amer: 59 mL/min — ABNORMAL LOW (ref >60)
GFR calc non Af Amer: 51 mL/min — ABNORMAL LOW (ref >60)
Glucose, Bld: 70 mg/dL (ref 70–99)
Potassium: 3.6 mmol/L (ref 3.5–5.1)
Sodium: 135 mmol/L (ref 135–145)
Total Bilirubin: 0.4 mg/dL (ref 0.3–1.2)
Total Protein: 4.6 g/dL — ABNORMAL LOW (ref 6.5–8.1)

## 2019-11-20 LAB — URINALYSIS, ROUTINE W REFLEX MICROSCOPIC
Bilirubin Urine: NEGATIVE
Glucose, UA: NEGATIVE mg/dL
Hgb urine dipstick: NEGATIVE
Ketones, ur: NEGATIVE mg/dL
Leukocytes,Ua: NEGATIVE
Nitrite: NEGATIVE
Protein, ur: NEGATIVE mg/dL
Specific Gravity, Urine: 1.013 (ref 1.005–1.030)
pH: 6 (ref 5.0–8.0)

## 2019-11-20 NOTE — Lactation Note (Signed)
This note was copied from a baby's chart. Lactation Consultation Note  Patient Name: Sabrina Richardson [id number] Date: 11/20/2019 Reason for consult: Initial assessment;1st time breastfeeding;Term P1, 35 hour term infant. Infant had 7 voids and 11 stools. Per mom, she is feeling challenge at times working on infant having a good latch where she is feeling a tug and not pain with latch, she has been breaking latch and re-latching infant.  LC did not see a latch at this time due to mom breastfeeding infant 15 minutes prior to [facility] entering the room. Mom is working on latch and infant is improving, infant is breastfeeding 10 to 15 minutes.   Maternal Data    Feeding Feeding Type: Breast Fed  LATCH Score                   Interventions Interventions: Assisted with latch;Skin to skin  Lactation Tools Discussed/Used     Consult Status Consult Status: Follow-up Date: 11/20/19 Follow-up type: In-patient    Danelle Earthly 11/20/2019, 6:57 AM

## 2019-11-20 NOTE — Progress Notes (Signed)
POSTOPERATIVE DAY # 2 S/P Primary LTCS for arrest of dilation, baby boy "Quest"  S:         Reports feeling okay; having some pain from lower extremity edema              Tolerating po intake / no nausea / no vomiting / + flatus / several loose BM  Denies dizziness, SOB, or CP             Bleeding is light             Pain controlled withTylenol and Oxy IR             Up ad lib / ambulatory/ voiding QS without difficulty  Newborn breast feeding - going well  / Circumcision - planning   O:  VS: BP 113/74 (BP Location: Left Arm)   Pulse 67   Temp 98.5 F (36.9 C) (Oral)   Resp 18   Ht 5' (1.524 m)   Wt 72.7 kg   LMP 02/17/2019 (Exact Date)   SpO2 100%   Breastfeeding Unknown   BMI 31.30 kg/m  Patient Vitals for the past 24 hrs:  BP Temp Temp src Pulse Resp SpO2  11/20/19 0619 113/74 98.5 F (36.9 C) Oral 67 18 100 %  11/19/19 2150 120/69 98.1 F (36.7 C) Oral 77 18 --  11/19/19 1331 119/70 97.7 F (36.5 C) Oral 68 18 --    LABS:               Recent Labs    11/19/19 0625 11/19/19 1619  WBC 13.3* 11.0*  HGB 10.7* 11.2*  PLT 173 173   Results for orders placed or performed during the hospital encounter of 11/18/19 (from the past 24 hour(s))  CBC with Differential/Platelet     Status: Abnormal   Collection Time: 11/19/19  4:19 PM  Result Value Ref Range   WBC 11.0 (H) 4.0 - 10.5 K/uL   RBC 3.57 (L) 3.87 - 5.11 MIL/uL   Hemoglobin 11.2 (L) 12.0 - 15.0 g/dL   HCT 44.0 (L) 34.7 - 42.5 %   MCV 94.4 80.0 - 100.0 fL   MCH 31.4 26.0 - 34.0 pg   MCHC 33.2 30.0 - 36.0 g/dL   RDW 95.6 38.7 - 56.4 %   Platelets 173 150 - 400 K/uL   nRBC 0.0 0.0 - 0.2 %   Neutrophils Relative % 73 %   Neutro Abs 8.1 (H) 1.7 - 7.7 K/uL   Lymphocytes Relative 13 %   Lymphs Abs 1.4 0.7 - 4.0 K/uL   Monocytes Relative 12 %   Monocytes Absolute 1.3 (H) 0.1 - 1.0 K/uL   Eosinophils Relative 0 %   Eosinophils Absolute 0.0 0.0 - 0.5 K/uL   Basophils Relative 0 %   Basophils Absolute 0.0 0.0 -  0.1 K/uL   Immature Granulocytes 2 %   Abs Immature Granulocytes 0.22 (H) 0.00 - 0.07 K/uL  Comprehensive metabolic panel     Status: Abnormal   Collection Time: 11/19/19  4:19 PM  Result Value Ref Range   Sodium 138 135 - 145 mmol/L   Potassium 3.9 3.5 - 5.1 mmol/L   Chloride 107 98 - 111 mmol/L   CO2 24 22 - 32 mmol/L   Glucose, Bld 72 70 - 99 mg/dL   BUN 20 6 - 20 mg/dL   Creatinine, Ser 3.32 (H) 0.44 - 1.00 mg/dL   Calcium 8.2 (L) 8.9 - 10.3 mg/dL   Total  Protein 4.8 (L) 6.5 - 8.1 g/dL   Albumin 1.8 (L) 3.5 - 5.0 g/dL   AST 36 15 - 41 U/L   ALT 24 0 - 44 U/L   Alkaline Phosphatase 122 38 - 126 U/L   Total Bilirubin 0.4 0.3 - 1.2 mg/dL   GFR calc non Af Amer 38 (L) >60 mL/min   GFR calc Af Amer 44 (L) >60 mL/min   Anion gap 7 5 - 15  Comprehensive metabolic panel     Status: Abnormal   Collection Time: 11/20/19  5:37 AM  Result Value Ref Range   Sodium 135 135 - 145 mmol/L   Potassium 3.6 3.5 - 5.1 mmol/L   Chloride 104 98 - 111 mmol/L   CO2 24 22 - 32 mmol/L   Glucose, Bld 70 70 - 99 mg/dL   BUN 19 6 - 20 mg/dL   Creatinine, Ser 1.31 (H) 0.44 - 1.00 mg/dL   Calcium 7.9 (L) 8.9 - 10.3 mg/dL   Total Protein 4.6 (L) 6.5 - 8.1 g/dL   Albumin 1.7 (L) 3.5 - 5.0 g/dL   AST 36 15 - 41 U/L   ALT 24 0 - 44 U/L   Alkaline Phosphatase 104 38 - 126 U/L   Total Bilirubin 0.4 0.3 - 1.2 mg/dL   GFR calc non Af Amer 51 (L) >60 mL/min   GFR calc Af Amer 59 (L) >60 mL/min   Anion gap 7 5 - 15               Bloodtype: --/--/B POS, B POS Performed at [facility], [address] [place], [place] [address]  [phone]/06 0930)  Rubella: Immune (09/02 0000)                                             I&O: Intake/Output      04/07 0701 - 04/08 0700 04/08 0701 - 04/09 0700   P.O. 1690    I.V. (mL/kg) 1501 (20.6)    Other 0    Total Intake(mL/kg) 3191 (43.9)    Urine (mL/kg/hr) 1650 (0.9)    Blood     Total Output 1650    Net +1541                      Physical Exam:              Alert and Oriented X3  Lungs: Clear and unlabored  Heart: regular rate and rhythm / no murmurs  Abdomen: soft, non-tender, moderate gaseous distention, hyperactive bowel sounds              Fundus: firm, non-tender, U-E             Dressing: honeycomb dressing with steri-strips c/d/i              Incision:  approximated with sutures / no erythema / no ecchymosis / no drainage  Perineum: intact  Lochia: small rubra on pad   Extremities: non-pitting LE edema, no calf pain or tenderness  A/P:    POD # 2 S/P Primary LTCS            Elevated serum creatinine without hypertension   - Consult inpatient nephrology   - UA sent  - WNL  Dependent edema   - increase hydration   - TED hose  Routine postoperative care   Lactation support  Continue current care              Plan of care in consult with Dr. Nelle Don, MSN, CNM [facility]

## 2019-11-21 LAB — BASIC METABOLIC PANEL
Anion gap: 10 (ref 5–15)
BUN: 16 mg/dL (ref 6–20)
CO2: 23 mmol/L (ref 22–32)
Calcium: 8.2 mg/dL — ABNORMAL LOW (ref 8.9–10.3)
Chloride: 105 mmol/L (ref 98–111)
Creatinine, Ser: 1 mg/dL (ref 0.44–1.00)
GFR calc Af Amer: >60 mL/min (ref >60)
GFR calc non Af Amer: >60 mL/min (ref >60)
Glucose, Bld: 77 mg/dL (ref 70–99)
Potassium: 3.8 mmol/L (ref 3.5–5.1)
Sodium: 138 mmol/L (ref 135–145)

## 2019-11-21 MED ORDER — OXYCODONE HCL 5 MG PO TABS: 5.0000 mg | ORAL_TABLET | ORAL | 0 refills | Status: DC | PRN

## 2019-11-21 NOTE — Lactation Note (Addendum)
This note was copied from a baby's chart. Lactation Consultation Note"  Infant is 67 hours old and is 11 %  wt loss. L C arrived in room and mother reports that she was getting ready to feed infant. Infant crying franticly. Assist father with soothing infant.   Advised mother to hand express before feeding. Father of infant assist with undressing infant and checking diaper.    Mother reports that she has only seen a tiny drop of milk when she pumped. Assist with proper hand expression and observed large drops of colostrum.  Infant latched on and mother described a 10 pain scale.  Infant lips were pursed and were adjusted . Mother  reports the pain became now a #5. After a few more seconds mother reports that pain went away. Infant sustained latch for 10 mins. Observed a few swallow.  Latched to alternate breast for 15 mins.   When infant released the breast mother nipple was compressed long gated.  Father reports that it has been this way each feeding.   expressed again and obtained several drops of colostrum. Mother Hand  became teary. Encouragement and support given.   Infant came off the alternate breast very hunger . Infant was to be fed 25 ml of donor milk . Suggest that infant may need more volume up to 30-35 or as much as needed.   Observed that infants tongue cups on all sides like a bowl. Infant has limited mobility and lateral movement.  Infant unable to life his tongue to the roof of his mouth.  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   Parents were given tongue and lip restriction resources information handout. Informed Dr Azucena Kuba of tongue evaluation.    Plan of Care : Breastfeed infant with feeding cues Supplement infant with ebm./donor milk according to supplemental guidelines. Pump using a DEBP after each feeding for 15-20 mins.   Mother to continue to cue base feed infant and feed at least 8-12 times or more in 24 hours and advised to allow for cluster  feeding infant as needed.   Mother to continue to due STS. Mother is aware of available LC services at [facility], BFSG'S, OP Dept, and phone # for questions or concerns about breastfeeding.  Mother receptive to all teaching and plan of care.      Patient Name: Sabrina Richardson [id number] Date: 11/21/2019 Reason for consult: Follow-up assessment   Maternal Data    Feeding Feeding Type: Breast Fed  LATCH Score Latch: Grasps breast easily, tongue down, lips flanged, rhythmical sucking.  Audible Swallowing: A few with stimulation  Type of Nipple: Everted at rest and after stimulation(nipple pinched when releases the breast)  Comfort (Breast/Nipple): Soft / non-tender  Hold (Positioning): Assistance needed to correctly position infant at breast and maintain latch.  LATCH Score: 8  Interventions Interventions: Assisted with latch;Skin to skin;Breast massage;Hand express;Breast compression;Adjust position;Support pillows;Position options;DEBP  Lactation Tools Discussed/Used     Consult Status Consult Status: Follow-up Date: 11/22/19 Follow-up type: In-patient    Stevan Born [facility] 11/21/2019, 1:23 PM

## 2019-11-21 NOTE — Discharge Summary (Signed)
Obstetric Discharge Summary Reason for Admission: onset of labor Prenatal Procedures: none Intrapartum Procedures: cesarean: low cervical, transverse Postpartum Procedures: none Complications-Operative and Postpartum: elevated lfts and creatinine that normalized before d/c home Hemoglobin  Date Value Ref Range Status  11/19/2019 11.2 (L) 12.0 - 15.0 g/dL Final   Hemoglobin, fingerstick  Date Value Ref Range Status  12/18/2013 13.4 12.0 - 16.0 g/dL Final   HCT  Date Value Ref Range Status  11/19/2019 33.7 (L) 36.0 - 46.0 % Final    Physical Exam:  General: alert, cooperative and no distress Lochia: appropriate Uterine Fundus: firm Incision: healing well DVT Evaluation: No evidence of DVT seen on physical exam.  Discharge Diagnoses: Term Pregnancy-delivered  Discharge Information: Date: 11/21/2019 Activity: pelvic rest Diet: routine Medications: Percocet Condition: stable Instructions: refer to practice specific booklet Discharge to: home Follow-up Information    Maxie Better, MD Follow up.   Specialty: Obstetrics and Gynecology Contact information: [address] [place] [address] [phone]           Newborn Data: Live born female  Birth Weight: 7 lb 12 oz (3515 g) APGAR: 9, 10  Newborn Delivery   Birth date/time: 11/18/2019 19:53:00 Delivery type: C-Section, Low Transverse Trial of labor: Yes C-section categorization: Primary      Home with mother.  Vick Frees 11/21/2019, 1:44 PM

## 2019-11-21 NOTE — Progress Notes (Signed)
No c/o; tol po; ambulating; +flatus, voiding w/o difficulty; pain controlled nml lochia No h/a, vision changes, ruq pain  Patient Vitals for the past 24 hrs:  BP Temp Temp src Pulse Resp SpO2  11/21/19 0603 121/75 98.6 F (37 C) Axillary (!) 58 18 --  11/20/19 2115 110/70 98.9 F (37.2 C) Oral 69 18 100 %  11/20/19 1448 111/68 98.3 F (36.8 C) Oral 64 17 --   A&ox3 rrr ctab Abd: soft, nt, nd; dressing c/d/i; fundus firm and below umb LE: +1 edema, nt bilat LE  Results for orders placed or performed during the hospital encounter of 11/18/19 (from the past 24 hour(s))  Basic metabolic panel     Status: Abnormal   Collection Time: 11/21/19  5:39 AM  Result Value Ref Range   Sodium 138 135 - 145 mmol/L   Potassium 3.8 3.5 - 5.1 mmol/L   Chloride 105 98 - 111 mmol/L   CO2 23 22 - 32 mmol/L   Glucose, Bld 77 70 - 99 mg/dL   BUN 16 6 - 20 mg/dL   Creatinine, Ser 1.00 0.44 - 1.00 mg/dL   Calcium 8.2 (L) 8.9 - 10.3 mg/dL   GFR calc non Af Amer >60 >60 mL/min   GFR calc Af Amer >60 >60 mL/min   Anion gap 10 5 - 15   CMP Latest Ref Rng & Units 11/21/2019 11/20/2019 11/19/2019  Glucose 70 - 99 mg/dL 77 70 72  BUN 6 - 20 mg/dL '16 19 20  ' Creatinine 0.44 - 1.00 mg/dL 1.00 1.31(H) 1.69(H)  Sodium 135 - 145 mmol/L 138 135 138  Potassium 3.5 - 5.1 mmol/L 3.8 3.6 3.9  Chloride 98 - 111 mmol/L 105 104 107  CO2 22 - 32 mmol/L '23 24 24  ' Calcium 8.9 - 10.3 mg/dL 8.2(L) 7.9(L) 8.2(L)  Total Protein 6.5 - 8.1 g/dL - 4.6(L) 4.8(L)  Total Bilirubin 0.3 - 1.2 mg/dL - 0.4 0.4  Alkaline Phos 38 - 126 U/L - 104 122  AST 15 - 41 U/L - 36 36  ALT 0 - 44 U/L - 24 24     A/P: pod 3 s/p ltcs for arrest of dilation  1. Post op - doing well, has met all milestones; ok for d/c home 2. Elevated creatinine - wnl today; plan outpatient f/u; pt has had good UOP with admission 3. Elevated lfts, nml now; pt may have had atipical pre-eclampsia with elevated lfts and cr, all wnl now; plan outpatient f/u; bps wnl  and pt w/o sx 4. Rh pos 5. Rubella immune 2.

## 2019-11-22 ENCOUNTER — Ambulatory Visit: Payer: Self-pay

## 2019-11-22 NOTE — Lactation Note (Signed)
This note was copied from a baby's chart. Lactation Consultation Note  Patient Name: Sabrina Richardson Date: 11/22/2019 Reason for consult: Follow-up assessment;Infant weight loss;Other (Comment);Term(weight gain) / hx PCOS - + breast changes per mom  Baby is 33 hours old  Baby in the nursery for a circ this am per mom.  Per mom  has only pumped with the DEBP x 1 and used her hand pump.  Was given a referral sheet for tongue - tie revision from the [facility] the [facility] yesterday .  LC reviewed supply and demand / stressed the importance of breast stimulation to 'establish and protect milk supply - by pumping 8-10 times a day .  Reassured mom the pumping can be a slow process.  LC reassured mom she can still  Can work on latching and if the baby latches  A adequate feeding should be 10 minutes or greater , ( 15 -20 mins 1st breast is average ) and then supplement at least 30 ml . If the baby doesn't latch the baby should be fed 30 - 45 ml due to weight loss and to grow.  After baby is settled , post pump both breast for 10 -15 mins / save milk / storage reviewed.  Sore nipple and engorgement prevention and tx reviewed .  Per mom has a DEBP [place] at home. Also a hand pump and DEBP kit from the hospital.  [facility] recommended follow - up with the [facility] O/P clinic and consented for this [place] to place a request . Mom aware she will receive a call Monday.     Maternal Data Has patient been taught Hand Expression?: Yes  Feeding Feeding Type: Donor Breast Milk Nipple Type: Slow - flow  LATCH Score                   Interventions    Lactation Tools Discussed/Used Pump Review: Milk Storage   Consult Status Consult Status: Follow-up Follow-up type: [place] 11/22/2019, 9:02 AM

## 2019-11-27 ENCOUNTER — Ambulatory Visit: Payer: BC Managed Care – PPO | Attending: Internal Medicine

## 2019-11-27 DIAGNOSIS — Z23 Encounter for immunization: Secondary | ICD-10-CM

## 2019-11-27 NOTE — Progress Notes (Signed)
   Covid-19 Vaccination Clinic  Name:  Sabrina Richardson    MRN: [id number] DOB: Dec 24, 1980  11/27/2019  Sabrina Richardson was observed post Covid-19 immunization for 15 minutes without incident. She was provided with Vaccine Information Sheet and instruction to access the V-Safe system.   Sabrina Richardson was instructed to call 911 with any severe reactions post vaccine: Marland Kitchen Difficulty breathing  . Swelling of face and throat  . A fast heartbeat  . A bad rash all over body  . Dizziness and weakness   Immunizations Administered    Name Date Dose VIS Date Route   Pfizer COVID-19 Vaccine 11/27/2019 10:39 AM 0.3 mL 07/25/2019 Intramuscular   Manufacturer: [employer], [employer]   Lot: [id number]   NDC: [address]-5

## 2019-12-22 ENCOUNTER — Ambulatory Visit: Payer: BC Managed Care – PPO | Attending: Internal Medicine

## 2019-12-22 DIAGNOSIS — Z23 Encounter for immunization: Secondary | ICD-10-CM

## 2019-12-22 NOTE — Progress Notes (Signed)
   Covid-19 Vaccination Clinic  Name:  Sabrina Richardson    MRN: [id number] DOB: [birth year]  12/22/2019  Sabrina Richardson was observed post Covid-19 immunization for 15 minutes without incident. She was provided with Vaccine Information Sheet and instruction to access the V-Safe system.   Sabrina Richardson was instructed to call 911 with any severe reactions post vaccine: Marland Kitchen Difficulty breathing  . Swelling of face and throat  . A fast heartbeat  . A bad rash all over body  . Dizziness and weakness   Immunizations Administered    Name Date Dose VIS Date Route   Pfizer COVID-19 Vaccine 12/22/2019 11:12 AM 0.3 mL 10/08/2018 Intramuscular   Manufacturer: [employer], [employer]   Lot: [id number]   NDC: [address]-3

## 2019-12-29 ENCOUNTER — Encounter: Payer: Self-pay | Admitting: Cardiovascular Disease

## 2020-01-19 ENCOUNTER — Ambulatory Visit: Payer: BC Managed Care – PPO | Admitting: Cardiovascular Disease

## 2020-01-19 ENCOUNTER — Encounter: Payer: Self-pay | Admitting: Cardiovascular Disease

## 2020-01-19 ENCOUNTER — Other Ambulatory Visit: Payer: Self-pay

## 2020-01-19 DIAGNOSIS — R030 Elevated blood-pressure reading, without diagnosis of hypertension: Secondary | ICD-10-CM

## 2020-01-19 NOTE — Patient Instructions (Addendum)
Medication Instructions:  STOP NIFEDIPINE    Labwork: NONE   Testing/Procedures: NONE   Follow-Up: VIRTUAL VISIT 1 MONTH 7//2021 AT 10:30 WITH PHARM D    Special Instructions:   MONITOR YOUR BLOOD PRESSURE TWICE A DAY, LOG IN BOOK PROVIDED. HAVE BOOK AVAILABLE AT FOLLOW UP VISIT GOAL BLOOD PRESSURE IS LESS THAN 130/80   LIMIT YOUR SODIUM INTAKE TO 1,500-2,000 MG A DAY   DASH Eating Plan DASH stands for "Dietary Approaches to Stop Hypertension." The DASH eating plan is a healthy eating plan that has been shown to reduce high blood pressure (hypertension). It may also reduce your risk for type 2 diabetes, heart disease, and stroke. The DASH eating plan may also help with weight loss. What are tips for following this plan?  General guidelines  Avoid eating more than 2,300 mg (milligrams) of salt (sodium) a day. If you have hypertension, you may need to reduce your sodium intake to 1,500 mg a day.  Limit alcohol intake to no more than 1 drink a day for nonpregnant women and 2 drinks a day for men. One drink equals 12 oz of beer, 5 oz of wine, or 1 oz of hard liquor.  Work with your health care provider to maintain a healthy body weight or to lose weight. Ask what an ideal weight is for you.  Get at least 30 minutes of exercise that causes your heart to beat faster (aerobic exercise) most days of the week. Activities may include walking, swimming, or biking.  Work with your health care provider or diet and nutrition specialist (dietitian) to adjust your eating plan to your individual calorie needs. Reading food labels   Check food labels for the amount of sodium per serving. Choose foods with less than 5 percent of the Daily Value of sodium. Generally, foods with less than 300 mg of sodium per serving fit into this eating plan.  To find whole grains, look for the word "whole" as the first word in the ingredient list. Shopping  Buy products labeled as "low-sodium" or "no salt  added."  Buy fresh foods. Avoid canned foods and premade or frozen meals. Cooking  Avoid adding salt when cooking. Use salt-free seasonings or herbs instead of table salt or sea salt. Check with your health care provider or pharmacist before using salt substitutes.  Do not fry foods. Cook foods using healthy methods such as baking, boiling, grilling, and broiling instead.  Cook with heart-healthy oils, such as olive, canola, soybean, or sunflower oil. Meal planning  Eat a balanced diet that includes: ? 5 or more servings of fruits and vegetables each day. At each meal, try to fill half of your plate with fruits and vegetables. ? Up to 6-8 servings of whole grains each day. ? Less than 6 oz of lean meat, poultry, or fish each day. A 3-oz serving of meat is about the same size as a deck of cards. One egg equals 1 oz. ? 2 servings of low-fat dairy each day. ? A serving of nuts, seeds, or beans 5 times each week. ? Heart-healthy fats. Healthy fats called Omega-3 fatty acids are found in foods such as flaxseeds and coldwater fish, like sardines, salmon, and mackerel.  Limit how much you eat of the following: ? Canned or prepackaged foods. ? Food that is high in trans fat, such as fried foods. ? Food that is high in saturated fat, such as fatty meat. ? Sweets, desserts, sugary drinks, and other foods with added sugar. ?  Full-fat dairy products.  Do not salt foods before eating.  Try to eat at least 2 vegetarian meals each week.  Eat more home-cooked food and less restaurant, buffet, and fast food.  When eating at a restaurant, ask that your food be prepared with less salt or no salt, if possible. What foods are recommended? The items listed may not be a complete list. Talk with your dietitian about what dietary choices are best for you. Grains Whole-grain or whole-wheat bread. Whole-grain or whole-wheat pasta. Brown rice. Modena Morrow. Bulgur. Whole-grain and low-sodium cereals.  Pita bread. Low-fat, low-sodium crackers. Whole-wheat flour tortillas. Vegetables Fresh or frozen vegetables (raw, steamed, roasted, or grilled). Low-sodium or reduced-sodium tomato and vegetable juice. Low-sodium or reduced-sodium tomato sauce and tomato paste. Low-sodium or reduced-sodium canned vegetables. Fruits All fresh, dried, or frozen fruit. Canned fruit in natural juice (without added sugar). Meat and other protein foods Skinless chicken or [place]. Ground chicken or [place]. Pork with fat trimmed off. Fish and seafood. Egg whites. Dried beans, peas, or lentils. Unsalted nuts, nut butters, and seeds. Unsalted canned beans. Lean cuts of beef with fat trimmed off. Low-sodium, lean deli meat. Dairy Low-fat (1%) or fat-free (skim) milk. Fat-free, low-fat, or reduced-fat cheeses. Nonfat, low-sodium ricotta or cottage cheese. Low-fat or nonfat yogurt. Low-fat, low-sodium cheese. Fats and oils Soft margarine without trans fats. Vegetable oil. Low-fat, reduced-fat, or light mayonnaise and salad dressings (reduced-sodium). Canola, safflower, olive, soybean, and sunflower oils. Avocado. Seasoning and other foods Herbs. Spices. Seasoning mixes without salt. Unsalted popcorn and pretzels. Fat-free sweets. What foods are not recommended? The items listed may not be a complete list. Talk with your dietitian about what dietary choices are best for you. Grains Baked goods made with fat, such as croissants, muffins, or some breads. Dry pasta or rice meal packs. Vegetables Creamed or fried vegetables. Vegetables in a cheese sauce. Regular canned vegetables (not low-sodium or reduced-sodium). Regular canned tomato sauce and paste (not low-sodium or reduced-sodium). Regular tomato and vegetable juice (not low-sodium or reduced-sodium). Angie Fava. Olives. Fruits Canned fruit in a light or heavy syrup. Fried fruit. Fruit in cream or butter sauce. Meat and other protein foods Fatty cuts of meat. Ribs. Fried  meat. Berniece Salines. Sausage. Bologna and other processed lunch meats. Salami. Fatback. Hotdogs. Bratwurst. Salted nuts and seeds. Canned beans with added salt. Canned or smoked fish. Whole eggs or egg yolks. Chicken or [place] with skin. Dairy Whole or 2% milk, cream, and half-and-half. Whole or full-fat cream cheese. Whole-fat or sweetened yogurt. Full-fat cheese. Nondairy creamers. Whipped toppings. Processed cheese and cheese spreads. Fats and oils Butter. Stick margarine. Lard. Shortening. Ghee. Bacon fat. Tropical oils, such as coconut, palm kernel, or palm oil. Seasoning and other foods Salted popcorn and pretzels. Onion salt, garlic salt, seasoned salt, table salt, and sea salt. Worcestershire sauce. Tartar sauce. Barbecue sauce. Teriyaki sauce. Soy sauce, including reduced-sodium. Steak sauce. Canned and packaged gravies. Fish sauce. Oyster sauce. Cocktail sauce. Horseradish that you find on the shelf. Ketchup. Mustard. Meat flavorings and tenderizers. Bouillon cubes. Hot sauce and Tabasco sauce. Premade or packaged marinades. Premade or packaged taco seasonings. Relishes. Regular salad dressings. Where to find more information:  National Heart, Lung, and [place]: https://wilson-eaton.com/  American Heart Association: www.heart.org Summary  The DASH eating plan is a healthy eating plan that has been shown to reduce high blood pressure (hypertension). It may also reduce your risk for type 2 diabetes, heart disease, and stroke.  With the DASH eating plan, you  should limit salt (sodium) intake to 2,300 mg a day. If you have hypertension, you may need to reduce your sodium intake to 1,500 mg a day.  When on the DASH eating plan, aim to eat more fresh fruits and vegetables, whole grains, lean proteins, low-fat dairy, and heart-healthy fats.  Work with your health care provider or diet and nutrition specialist (dietitian) to adjust your eating plan to your individual calorie needs. This information is  not intended to replace advice given to you by your health care provider. Make sure you discuss any questions you have with your health care provider. Document Released: 07/20/2011 Document Revised: 07/13/2017 Document Reviewed: 07/24/2016 Elsevier Patient Education  2020 [employer].

## 2020-01-19 NOTE — Progress Notes (Signed)
Hypertension Clinic Initial Assessment:    Date:  01/20/2020   ID:  Sabrina Richardson, DOB 05/09/1981, MRN [id number]  PCP:  Sabrina Pepper, MD  Cardiologist:  No primary care provider on file.  Nephrologist:  Referring MD: Sabrina Salina, MD   CC: Hypertension  History of Present Illness:    Sabrina Richardson is a 39 y.o. female with a hx of PCOS and elevated blood pressure here to establish care in the hypertension clinic.  [name] delivered her first child on 11/2019.  At the time of delivery her labs were notable for creatinine elevated to 1.69 and AST elevated to 52.  These labs normalized prior to discharge.  There is some concern that she may have had an atypical preeclampsia picture.  There is no noted elevated blood pressures at the time of delivery or during pregnancy.  At her 6-week post delivery appointment her blood pressure was initially 130/90.  They rechecked it was 135/105, followed by 151/107.  She was started on nifedipine 30 mg and her blood pressure has been well-controlled.  She has not had any elevated blood pressures at home overall she is feeling well and has no complaints.  She tries to walk for exercise but does not do it as regularly as she would like.  She has no exertional chest pain or shortness of breath.  Post delivery she had some edema in her legs and hands.  This has subsided.  She delivered via cesarean.  She is now healed and doing quite well.  She notes that her diet is pretty good.  She is unsure what her sodium intake is.  She denies any headaches or vision changes.   Past Medical History:  Diagnosis Date   Chlamydia contact, treated 2009   Elevated blood pressure reading 01/20/2020   Migraines 2007   per pt none now   PCOS (polycystic ovarian syndrome)     Past Surgical History:  Procedure Laterality Date   CESAREAN SECTION N/A 11/18/2019   Procedure: CESAREAN SECTION;  Surgeon: Sabrina Salina, MD;  Location: [facility] LD [facility]S;  Service: Obstetrics;   Laterality: N/A;   NO PAST SURGERIES      Current Medications: Current Meds  Medication Sig   NORLYDA 0.35 MG tablet Take 1 tablet by mouth daily.   Prenatal Vit-Fe Fumarate-FA (PRENATAL VITAMIN PO) Take by mouth daily.   [DISCONTINUED] NIFEdipine (PROCARDIA-XL/NIFEDICAL-XL) 30 MG 24 hr tablet Take 30 mg by mouth daily.     Allergies:   Patient has no known allergies.   Social History   Socioeconomic History   Marital status: Married    Spouse name: Sabrina Richardson   Number of children: Not on file   Years of education: Not on file   Highest education level: Not on file  Occupational History   Not on file  Tobacco Use   Smoking status: Never Smoker   Smokeless tobacco: Never Used  Substance and Sexual Activity   Alcohol use: No    Alcohol/week: 0.0 standard drinks   Drug use: No   Sexual activity: Yes    Partners: Male    Birth control/protection: None  Other Topics Concern   Not on file  Social History Narrative   Not on file   Social Determinants of Health   Financial Resource Strain:    Difficulty of Paying Living Expenses:   Food Insecurity:    Worried About [place] in the Last Year:    [place] in the  Last Year:   Transportation Needs:    Freight forwarder (Medical):    Lack of Transportation (Non-Medical):   Physical Activity:    Days of Exercise per Week:    Minutes of Exercise per Session:   Stress:    Feeling of Stress :   Social Connections:    Frequency of Communication with Friends and Family:    Frequency of Social Gatherings with Friends and Family:    Attends Religious Services:    Active Member of Clubs or Organizations:    Attends Engineer, structural:    Marital Status:      Family History: The patient's family history includes Diabetes in her maternal grandmother, mother, and paternal grandfather; Heart attack in her maternal uncle; Hyperlipidemia in her father; Hypertension in  her father and mother; Stroke in her paternal grandfather.  ROS:   Please see the history of present illness.    All other systems reviewed and are negative.  EKGs/Labs/Other Studies Reviewed:    EKG:  EKG is ordered today.  The ekg ordered today demonstrates sinus rhythm.  Rate 91 bpm.  Low voltage  Recent Labs: 11/19/2019: Hemoglobin 11.2; Platelets 173 11/20/2019: ALT 24 11/21/2019: BUN 16; Creatinine, Ser 1.00; Potassium 3.8; Sodium 138   Recent Lipid Panel    Component Value Date/Time   CHOL 160 02/15/2018 1406   TRIG 63 02/15/2018 1406   HDL 52 02/15/2018 1406   CHOLHDL 3.1 02/15/2018 1406   CHOLHDL 3.1 12/18/2013 1421   VLDL 13 12/18/2013 1421   LDLCALC 95 02/15/2018 1406    Physical Exam:    VS:  BP 110/60    Pulse 91    Ht 5\' 1"  (1.549 m)    Wt 130 lb (59 kg)    LMP 02/17/2019 (Exact Date)    SpO2 99%    BMI 24.56 kg/m     Wt Readings from Last 3 Encounters:  01/19/20 130 lb (59 kg)  11/18/19 160 lb 4.4 oz (72.7 kg)  11/16/19 162 lb 12.8 oz (73.8 kg)     VS:  BP 110/60    Pulse 91    Ht 5\' 1"  (1.549 m)    Wt 130 lb (59 kg)    LMP 02/17/2019 (Exact Date)    SpO2 99%    BMI 24.56 kg/m  , BMI Body mass index is 24.56 kg/m. GENERAL:  Well appearing.  No acute distress HEENT: Pupils equal round and reactive, fundi not visualized, oral mucosa unremarkable NECK:  No jugular venous distention, waveform within normal limits, carotid upstroke brisk and symmetric, no bruits, no thyromegaly LYMPHATICS:  No cervical adenopathy LUNGS:  Clear to auscultation bilaterally HEART:  RRR.  PMI not displaced or sustained,S1 and S2 within normal limits, no S3, no S4, no clicks, no rubs, no murmurs ABD:  Flat, positive bowel sounds normal in frequency in pitch, no bruits, no rebound, no guarding, no midline pulsatile mass, no hepatomegaly, no splenomegaly EXT:  2 plus pulses throughout, no edema, no cyanosis no clubbing SKIN:  No rashes no nodules NEURO:  Cranial nerves II through XII  grossly intact, motor grossly intact throughout PSYCH:  Cognitively intact, oriented to person place and time  ASSESSMENT:    1. Elevated blood pressure reading     PLAN:    # Elevated BP: Ms. ' blood pressure was elevated after delivery.  She was started on nifedipine and is tolerating this well.  She has no preceding history of hypertension.  Her blood  pressure today is very well have been controlled on low-dose nifedipine.  She is going to try stopping it and checking her blood pressure twice daily.  She will bring this to follow-up to determine if she still needs to have treatment.  We also discussed increasing her exercise and limiting sodium intake.  She is potentially considering having another child.  Therefore she does need an antihypertensive, nifedipine certainly seems to work well and is quite reasonable.   Disposition:    FU with MD/PharmD in 1 month virtually   Medication Adjustments/Labs and Tests Ordered: Current medicines are reviewed at length with the patient today.  Concerns regarding medicines are outlined above.  Orders Placed This Encounter  Procedures   EKG 12-Lead   No orders of the defined types were placed in this encounter.    Signed, Chilton Si, MD  01/20/2020 10:35 AM    [facility]

## 2020-01-20 ENCOUNTER — Encounter: Payer: Self-pay | Admitting: Cardiovascular Disease

## 2020-01-20 DIAGNOSIS — R030 Elevated blood-pressure reading, without diagnosis of hypertension: Secondary | ICD-10-CM

## 2020-01-20 HISTORY — DX: Elevated blood-pressure reading, without diagnosis of hypertension: R03.0

## 2020-02-19 ENCOUNTER — Telehealth (INDEPENDENT_AMBULATORY_CARE_PROVIDER_SITE_OTHER): Payer: BC Managed Care – PPO | Admitting: Pharmacist Clinician (PhC)/ Clinical Pharmacy Specialist

## 2020-02-19 ENCOUNTER — Telehealth: Payer: Self-pay

## 2020-02-19 ENCOUNTER — Encounter: Payer: Self-pay | Admitting: Pharmacist Clinician (PhC)/ Clinical Pharmacy Specialist

## 2020-02-19 DIAGNOSIS — R030 Elevated blood-pressure reading, without diagnosis of hypertension: Secondary | ICD-10-CM

## 2020-02-19 NOTE — Telephone Encounter (Signed)
  Patient Consent for Virtual Visit         Sabrina Richardson has provided verbal consent on 02/19/2020 for a virtual visit (video or telephone).   CONSENT FOR VIRTUAL VISIT FOR:  Sabrina Richardson  By participating in this virtual visit I agree to the following:  I hereby voluntarily request, consent and authorize [facility] and its employed or contracted physicians, physician assistants, nurse practitioners or other licensed health care professionals (the Practitioner), to provide me with telemedicine health care services (the "Services") as deemed necessary by the treating Practitioner. I acknowledge and consent to receive the Services by the Practitioner via telemedicine. I understand that the telemedicine visit will involve communicating with the Practitioner through live audiovisual communication technology and the disclosure of certain medical information by electronic transmission. I acknowledge that I have been given the opportunity to request an in-person assessment or other available alternative prior to the telemedicine visit and am voluntarily participating in the telemedicine visit.  I understand that I have the right to withhold or withdraw my consent to the use of telemedicine in the course of my care at any time, without affecting my right to future care or treatment, and that the Practitioner or I may terminate the telemedicine visit at any time. I understand that I have the right to inspect all information obtained and/or recorded in the course of the telemedicine visit and may receive copies of available information for a reasonable fee.  I understand that some of the potential risks of receiving the Services via telemedicine include:  Marland Kitchen Delay or interruption in medical evaluation due to technological equipment failure or disruption; . Information transmitted may not be sufficient (e.g. poor resolution of images) to allow for appropriate medical decision making by the Practitioner; and/or   . In rare instances, security protocols could fail, causing a breach of personal health information.  Furthermore, I acknowledge that it is my responsibility to provide information about my medical history, conditions and care that is complete and accurate to the best of my ability. I acknowledge that Practitioner's advice, recommendations, and/or decision may be based on factors not within their control, such as incomplete or inaccurate data provided by me or distortions of diagnostic images or specimens that may result from electronic transmissions. I understand that the practice of medicine is not an exact science and that Practitioner makes no warranties or guarantees regarding treatment outcomes. I acknowledge that a copy of this consent can be made available to me via my patient portal [facility] MyChart), or I can request a printed copy by calling the office of [facility].    I understand that my insurance will be billed for this visit.   I have read or had this consent read to me. . I understand the contents of this consent, which adequately explains the benefits and risks of the Services being provided via telemedicine.  . I have been provided ample opportunity to ask questions regarding this consent and the Services and have had my questions answered to my satisfaction. . I give my informed consent for the services to be provided through the use of telemedicine in my medical care

## 2020-02-21 ENCOUNTER — Encounter: Payer: Self-pay | Admitting: Pharmacist Clinician (PhC)/ Clinical Pharmacy Specialist

## 2020-02-21 NOTE — Assessment & Plan Note (Addendum)
Patient currently doing well, with no elevated BP readings.  She did have some elevated readings post-partum and was put on nifedipine xl 30 mg after her April delivery.  Since stopping, her BP readings have remained consistently WNL.  Advised that she monitor her pressure on and off for now, and should she see it start to go back up > 130/80 on multiple occasions, she should contact our office

## 2020-02-21 NOTE — Progress Notes (Signed)
° ° ° °  02/21/2020 Sabrina Richardson [birth year] [id number]   HPI:  Sabrina Richardson is a 39 y.o. female patient of Dr Duke Salvia, with a PMH below who presents virtually today for hypertension clinic evaluation.  Unfortunately the sound was not working on her end, so we ended up talking by phone during the visit.    Patient was referred to Dr. Duke Salvia in June after a questionable post partum preeclampsia.  Her SCr elevated to 1.69 and AST to 52 at the time of C-Section delivery on April 6.  Both values normalized prior to discharge.  At her 6 week post-partum follow up her pressure reached 151/107.  She was started on nifiedipine xl 30 mg and referred to Dr. Duke Salvia for further evaluation.  At the time of that appointment (June 7), her BP was well controlled on the nifedipine and she was recovering well.   At that visit her BP was 110/60 and it was decided to discontinue nifedipine and see if she did well without.    Today we spoke by phone, with the silent video as well.  She has been feeling well, her son is now 56 months old and they are getting some sleep, but still up every 3-4 hours.  Her home BP readings have all been well WNL (see below)   Blood Pressure Goal:  130/80  Current Medications: none  Diet: drinking plenty of water, trying to be more aware of the sodium in her diet, but with new baby, has not been too focused on this  Exercise:  Walks around her neighborhood  Home BP readings: had about 6-7 readings, noted averages 100's/70's, with highest being 122/92  Intolerances: nkda  Labs: 5/21:  Na 139, K 4.2, Glu 76, BUN 12, SCr 1.09  Wt Readings from Last 3 Encounters:  02/19/20 128 lb (58.1 kg)  01/19/20 130 lb (59 kg)  11/18/19 160 lb 4.4 oz (72.7 kg)   BP Readings from Last 3 Encounters:  02/19/20 109/84  01/19/20 110/60  11/21/19 121/75   Pulse Readings from Last 3 Encounters:  01/19/20 91  11/21/19 (!) 58  11/17/19 (!) 114    Current Outpatient Medications  Medication Sig  Dispense Refill   NORLYDA 0.35 MG tablet Take 1 tablet by mouth daily.     Prenatal Vit-Fe Fumarate-FA (PRENATAL VITAMIN PO) Take by mouth daily.     No current facility-administered medications for this visit.    No Known Allergies  Past Medical History:  Diagnosis Date   Chlamydia contact, treated 2009   Elevated blood pressure reading 01/20/2020   Migraines 2007   per pt none now   PCOS (polycystic ovarian syndrome)     Blood pressure 109/84, height 5\' 1"  (1.549 m), weight 128 lb (58.1 kg), unknown if currently breastfeeding.  Elevated blood pressure reading Patient currently doing well, with no elevated BP readings.  She did have some elevated readings post-partum and was put on nifedipine xl 30 mg after her April delivery.  Since stopping, her BP readings have remained consistently WNL.  Advised that she monitor her pressure on and off for now, and should she see it start to go back up > 130/80 on multiple occasions, she should contact our office    May PharmD CPP [facility] [address] [place], [place] [phone]

## 2020-02-21 NOTE — Patient Instructions (Signed)
°  Check your blood pressure at home on occasion and keep record of the readings.  Bring all of your meds, your BP cuff and your record of home blood pressures to your next appointment.  Exercise as youre able, try to walk approximately 30 minutes per day.  Keep salt intake to a minimum, especially watch canned and prepared boxed foods.  Eat more fresh fruits and vegetables and fewer canned items.  Avoid eating in fast food restaurants.    HOW TO TAKE YOUR BLOOD PRESSURE:  Rest 5 minutes before taking your blood pressure.   Dont smoke or drink caffeinated beverages for at least 30 minutes before.  Take your blood pressure before (not after) you eat.  Sit comfortably with your back supported and both feet on the floor (dont cross your legs).  Elevate your arm to heart level on a table or a desk.  Use the proper sized cuff. It should fit smoothly and snugly around your bare upper arm. There should be enough room to slip a fingertip under the cuff. The bottom edge of the cuff should be 1 inch above the crease of the elbow.  Ideally, take 3 measurements at one sitting and record the average.

## 2020-03-01 ENCOUNTER — Ambulatory Visit: Payer: BC Managed Care – PPO | Admitting: Certified Nurse Midwife

## 2020-11-25 ENCOUNTER — Other Ambulatory Visit: Payer: Self-pay

## 2020-11-25 ENCOUNTER — Encounter: Payer: Self-pay | Admitting: Obstetrics & Gynecology

## 2020-11-25 ENCOUNTER — Other Ambulatory Visit (HOSPITAL_COMMUNITY)
Admission: RE | Admit: 2020-11-25 | Discharge: 2020-11-25 | Disposition: A | Discharge status: Home / Self Care | Payer: BC Managed Care – PPO | Source: Ambulatory Visit | Attending: Obstetrics & Gynecology | Admitting: Obstetrics & Gynecology

## 2020-11-25 ENCOUNTER — Ambulatory Visit: Payer: BC Managed Care – PPO | Admitting: Obstetrics & Gynecology

## 2020-11-25 VITALS — BP 110/68 | Ht 61.0 in | Wt 116.0 lb

## 2020-11-25 DIAGNOSIS — Z30011 Encounter for initial prescription of contraceptive pills: Secondary | ICD-10-CM

## 2020-11-25 DIAGNOSIS — Z01419 Encounter for gynecological examination (general) (routine) without abnormal findings: Secondary | ICD-10-CM

## 2020-11-25 DIAGNOSIS — Z8742 Personal history of other diseases of the female genital tract: Secondary | ICD-10-CM | POA: Diagnosis not present

## 2020-11-25 MED ORDER — NORETHIN ACE-ETH ESTRAD-FE 1-20 MG-MCG PO TABS: 1.0000 | ORAL_TABLET | Freq: Every day | ORAL | 4 refills | Status: DC

## 2020-11-25 NOTE — Progress Notes (Signed)
Sabrina Richardson 1981-03-03 [id number]   History:    40 y.o. G25P1L1 Son is 1 yo.  C/S 11/2019.  RP:  Established patient presenting for annual gyn exam   HPI: H/O PCOS, but mild as patient is currently having regular menses and had a son 1 year ago.  Conceived after relative infertility, on Metformin.  C/S for arrest of progression.  No pelvic pain.  Needs contraception.  Breasts normal.  BMI 21.92.  Health labs with Fam MD.  Past medical history,surgical history, family history and social history were all reviewed and documented in the EPIC chart.  Gynecologic History Patient's last menstrual period was 11/25/2020.  Obstetric History OB History  Gravida Para Term Preterm AB Living  1 1 1     1   SAB IAB Ectopic Multiple Live Births        0 1    # Outcome Date GA Lbr Len/2nd Weight Sex Delivery Anes PTL Lv  1 Term 11/18/19 [redacted]w[redacted]d  7 lb 12 oz (3.515 kg) M CS-LTranv EPI  LIV     ROS: A ROS was performed and pertinent positives and negatives are included in the history.  GENERAL: No fevers or chills. HEENT: No change in vision, no earache, sore throat or sinus congestion. NECK: No pain or stiffness. CARDIOVASCULAR: No chest pain or pressure. No palpitations. PULMONARY: No shortness of breath, cough or wheeze. GASTROINTESTINAL: No abdominal pain, nausea, vomiting or diarrhea, melena or bright red blood per rectum. GENITOURINARY: No urinary frequency, urgency, hesitancy or dysuria. MUSCULOSKELETAL: No joint or muscle pain, no back pain, no recent trauma. DERMATOLOGIC: No rash, no itching, no lesions. ENDOCRINE: No polyuria, polydipsia, no heat or cold intolerance. No recent change in weight. HEMATOLOGICAL: No anemia or easy bruising or bleeding. NEUROLOGIC: No headache, seizures, numbness, tingling or weakness. PSYCHIATRIC: No depression, no loss of interest in normal activity or change in sleep pattern.     Exam:   BP 110/68   Ht 5\' 1"  (1.549 m)   Wt 116 lb (52.6 kg)   LMP 11/25/2020  Comment: pill  BMI 21.92 kg/m   Body mass index is 21.92 kg/m.  General appearance : Well developed well nourished female. No acute distress HEENT: Eyes: no retinal hemorrhage or exudates,  Neck supple, trachea midline, no carotid bruits, no thyroidmegaly Lungs: Clear to auscultation, no rhonchi or wheezes, or rib retractions  Heart: Regular rate and rhythm, no murmurs or gallops Breast:Examined in sitting and supine position were symmetrical in appearance, no palpable masses or tenderness,  no skin retraction, no nipple inversion, no nipple discharge, no skin discoloration, no axillary or supraclavicular lymphadenopathy Abdomen: no palpable masses or tenderness, no rebound or guarding Extremities: no edema or skin discoloration or tenderness  Pelvic: Vulva: Normal             Vagina: No gross lesions or discharge  Cervix: No gross lesions or discharge.  Pap reflex done.  Uterus  AV, normal size, shape and consistency, non-tender and mobile  Adnexa  Without masses or tenderness  Anus: Normal   Assessment/Plan:  40 y.o. female for annual exam   1. Encounter for routine gynecological examination with Papanicolaou smear of cervix Normal gyn exam.  Pap reflex done.  Breasts normal.  Screening mammo to schedule.  Fasting labs with Fam MD. - Cytology - PAP( [place])  2. Encounter for initial prescription of contraceptive pills Counseling on contraception.  Decision to start on a low dose BCP.  No CI.  Usage reviewed and prescription sent to pharmacy.  3. History of PCOS Decision to start on the BCP for cycle control and contraception.  Other orders - norethindrone-ethinyl estradiol (JUNEL FE 1/20) 1-20 MG-MCG tablet; Take 1 tablet by mouth daily.  Genia Del MD, 2:16 PM 11/25/2020

## 2020-11-26 LAB — CYTOLOGY - PAP: Diagnosis: NEGATIVE

## 2020-11-28 ENCOUNTER — Encounter: Payer: Self-pay | Admitting: Obstetrics & Gynecology

## 2021-04-06 ENCOUNTER — Telehealth: Payer: Self-pay | Admitting: Cardiovascular Disease

## 2021-04-06 NOTE — Telephone Encounter (Signed)
Spoke with the patient who reports that a few months ago she started trying to get back into an exercise routine. She had not been exercising since she gave  birth last year. Patient states that she has been getting some chest tightness when she exercises. She states that she gets worried so she stops working out when the tightness comes on. She reports no chest pain/tightness at rest. She reports no associated symptoms such as SOB, dizziness, N/V, radiation of pain, or numbness/tingling.  She states that she has not been monitoring her blood pressure regularly since she had her baby but states that when it is checked it is typically good. She states that she just wants to make sure everything is okay with her heart before continuing to workout.

## 2021-04-06 NOTE — Telephone Encounter (Signed)
Pt c/o of Chest Pain: STAT if CP now or developed within 24 hours  1. Are you having CP right now? no  2. Are you experiencing any other symptoms (ex. SOB, nausea, vomiting, sweating)? no  3. How long have you been experiencing CP? Several months, during exercise  4. Is your CP continuous or coming and going? Comes and goes  5. Have you taken Nitroglycerin? no   Patient states she has been having chest tightness when she tries to do more strenuous exercise. She states since having her child she tried exercising again and has been avoiding it now due to the chest tightness. She says she is not having any other symptoms.   ?

## 2021-04-11 NOTE — Telephone Encounter (Signed)
Called patient to offer appointment with Dr. Duke Salvia on Friday. Patient agreed to date and time.

## 2021-04-13 ENCOUNTER — Other Ambulatory Visit: Payer: Self-pay | Admitting: Family Medicine

## 2021-04-13 DIAGNOSIS — Z1231 Encounter for screening mammogram for malignant neoplasm of breast: Secondary | ICD-10-CM

## 2021-04-15 ENCOUNTER — Other Ambulatory Visit: Payer: Self-pay

## 2021-04-15 ENCOUNTER — Ambulatory Visit (HOSPITAL_BASED_OUTPATIENT_CLINIC_OR_DEPARTMENT_OTHER): Payer: BC Managed Care – PPO | Admitting: Cardiovascular Disease

## 2021-04-15 ENCOUNTER — Ambulatory Visit (INDEPENDENT_AMBULATORY_CARE_PROVIDER_SITE_OTHER): Payer: BC Managed Care – PPO

## 2021-04-15 ENCOUNTER — Other Ambulatory Visit (HOSPITAL_COMMUNITY): Payer: Self-pay

## 2021-04-15 ENCOUNTER — Encounter (HOSPITAL_BASED_OUTPATIENT_CLINIC_OR_DEPARTMENT_OTHER): Payer: Self-pay | Admitting: Cardiovascular Disease

## 2021-04-15 ENCOUNTER — Telehealth (HOSPITAL_BASED_OUTPATIENT_CLINIC_OR_DEPARTMENT_OTHER): Payer: Self-pay | Admitting: *Deleted

## 2021-04-15 ENCOUNTER — Other Ambulatory Visit (HOSPITAL_BASED_OUTPATIENT_CLINIC_OR_DEPARTMENT_OTHER): Payer: Self-pay

## 2021-04-15 VITALS — BP 104/78 | HR 74 | Ht 61.0 in | Wt 120.6 lb

## 2021-04-15 DIAGNOSIS — R002 Palpitations: Secondary | ICD-10-CM

## 2021-04-15 DIAGNOSIS — R030 Elevated blood-pressure reading, without diagnosis of hypertension: Secondary | ICD-10-CM

## 2021-04-15 DIAGNOSIS — R079 Chest pain, unspecified: Secondary | ICD-10-CM | POA: Diagnosis not present

## 2021-04-15 DIAGNOSIS — R0789 Other chest pain: Secondary | ICD-10-CM | POA: Diagnosis not present

## 2021-04-15 HISTORY — DX: Palpitations: R00.2

## 2021-04-15 MED ORDER — IVABRADINE HCL 5 MG PO TABS
ORAL_TABLET | ORAL | 0 refills | Status: DC
  Filled 2021-04-15 – 2021-04-26 (×2): qty 2, 1d supply, fill #0

## 2021-04-15 NOTE — Patient Instructions (Addendum)
Medication Instructions:  TAKE IVABRADINE 2 HOURS PRIOR TO CT  WILL CALL YOU WITH WHERE TO PICK THIS UP   *If you need a refill on your cardiac medications before your next appointment, please call your pharmacy*  Lab Work: BMET/MAGNESIUM/TSH 1 WEEK PRIOR TO CT   If you have labs (blood work) drawn today and your tests are completely normal, you will receive your results only by: MyChart Message (if you have MyChart) OR A paper copy in the mail If you have any lab test that is abnormal or we need to change your treatment, we will call you to review the results.   Testing/Procedures: Your physician has requested that you have cardiac CT. Cardiac computed tomography (CT) is a painless test that uses an x-ray machine to take clear, detailed pictures of your heart. For further information please visit [email or web address] Please follow instruction sheet as given. ONCE INSURANCE HAS BEEN REVIEWED THE OFFICE WILL CALL YOU TO ARRANGE. IF YOU HAVE NOT HEAR IN 2 WEEKS CALL TO FOLLOW UP   14 DAY ZIO MONITOR PLACED IN OFFICE   Follow-Up: At [facility], you and your health needs are our priority.  As part of our continuing mission to provide you with exceptional heart care, we have created designated Provider Care Teams.  These Care Teams include your primary Cardiologist (physician) and Advanced Practice Providers (APPs -  Physician Assistants and Nurse Practitioners) who all work together to provide you with the care you need, when you need it.  We recommend signing up for the patient portal called "MyChart".  Sign up information is provided on this After Visit Summary.  MyChart is used to connect with patients for Virtual Visits (Telemedicine).  Patients are able to view lab/test results, encounter notes, upcoming appointments, etc.  Non-urgent messages can be sent to your provider as well.   To learn more about what you can do with MyChart, go to [email or web address].    Your next  appointment:   3 month(s)  The format for your next appointment:   In Person  Provider:   Chilton Si, MD  Other Instructions    Your cardiac CT will be scheduled at one of the below locations:   [facility] [address] [place], [place] [address] [phone]  OR  [facility] [address] [place], [place] [address] [phone]  If scheduled at [facility], please arrive at the [facility] main entrance (entrance A) of [facility] 30 minutes prior to test start time. Proceed to the [facility] Radiology Department (first floor) to check-in and test prep.  If scheduled at [facility], please arrive 15 mins early for check-in and test prep.  Please follow these instructions carefully (unless otherwise directed):  Hold all erectile dysfunction medications at least 3 days (72 hrs) prior to test.  On the Night Before the Test: Be sure to Drink plenty of water. Do not consume any caffeinated/decaffeinated beverages or chocolate 12 hours prior to your test. Do not take any antihistamines 12 hours prior to your test. If the patient has contrast allergy: Patient will need a prescription for Prednisone and very clear instructions (as follows): Prednisone 50 mg - take 13 hours prior to test Take another Prednisone 50 mg 7 hours prior to test Take another Prednisone 50 mg 1 hour prior to test Take Benadryl 50 mg 1 hour prior to test Patient must complete all four doses of above prophylactic  medications. Patient will need a ride after test due to Benadryl.  On the Day of the Test: Drink plenty of water until 1 hour prior to the test. Do not eat any food 4 hours prior to the test. You may take your regular medications prior to the test.  Take metoprolol (Lopressor) two hours prior to test. HOLD Furosemide/Hydrochlorothiazide morning of the test. FEMALES- please wear  underwire-free bra if available, avoid dresses & tight clothing      After the Test: Drink plenty of water. After receiving IV contrast, you may experience a mild flushed feeling. This is normal. On occasion, you may experience a mild rash up to 24 hours after the test. This is not dangerous. If this occurs, you can take Benadryl 25 mg and increase your fluid intake. If you experience trouble breathing, this can be serious. If it is severe call 911 IMMEDIATELY. If it is mild, please call our office. If you take any of these medications: Glipizide/Metformin, Avandament, Glucavance, please do not take 48 hours after completing test unless otherwise instructed.  Please allow 2-4 weeks for scheduling of routine cardiac CTs. Some insurance companies require a pre-authorization which may delay scheduling of this test.   For non-scheduling related questions, please contact the cardiac imaging nurse navigator should you have any questions/concerns: Rockwell Alexandria, Cardiac Imaging Nurse Navigator Larey Brick, Cardiac Imaging Nurse Navigator [facility] Direct Office Dial: [phone]   For scheduling needs, including cancellations and rescheduling, please call [place], [phone].  Cardiac CT Angiogram A cardiac CT angiogram is a procedure to look at the heart and the area around the heart. It may be done to help find the cause of chest pains or other symptoms of heart disease. During this procedure, a substance called contrast dye is injected into the blood vessels in the area to be checked. A large X-ray machine, called a CT scanner, then takes detailed pictures of the heart and the surrounding area. The procedure is also sometimes called a coronary CT angiogram, coronary artery scanning, or CTA. A cardiac CT angiogram allows the health care provider to see how well blood is flowing to and from the heart. The health care provider will be able to see if there are any  problems, such as: Blockage or narrowing of the coronary arteries in the heart. Fluid around the heart. Signs of weakness or disease in the muscles, valves, and tissues of the heart. Tell a health care provider about: Any allergies you have. This is especially important if you have had a previous allergic reaction to contrast dye. All medicines you are taking, including vitamins, herbs, eye drops, creams, and over-the-counter medicines. Any blood disorders you have. Any surgeries you have had. Any medical conditions you have. Whether you are pregnant or may be pregnant. Any anxiety disorders, chronic pain, or other conditions you have that may increase your stress or prevent you from lying still. What are the risks? Generally, this is a safe procedure. However, problems may occur, including: Bleeding. Infection. Allergic reactions to medicines or dyes. Damage to other structures or organs. Kidney damage from the contrast dye that is used. Increased risk of cancer from radiation exposure. This risk is low. Talk with your health care provider about: The risks and benefits of testing. How you can receive the lowest dose of radiation. What happens before the procedure? Wear comfortable clothing and remove any jewelry, glasses, dentures, and hearing aids. Follow instructions from your health care provider about eating  and drinking. This may include: For 12 hours before the procedure -- avoid caffeine. This includes tea, coffee, soda, energy drinks, and diet pills. Drink plenty of water or other fluids that do not have caffeine in them. Being well hydrated can prevent complications. For 4-6 hours before the procedure -- stop eating and drinking. The contrast dye can cause nausea, but this is less likely if your stomach is empty. Ask your health care provider about changing or stopping your regular medicines. This is especially important if you are taking diabetes medicines, blood thinners, or  medicines to treat problems with erections (erectile dysfunction). What happens during the procedure?  Hair on your chest may need to be removed so that small sticky patches called electrodes can be placed on your chest. These will transmit information that helps to monitor your heart during the procedure. An IV will be inserted into one of your veins. You might be given a medicine to control your heart rate during the procedure. This will help to ensure that good images are obtained. You will be asked to lie on an exam table. This table will slide in and out of the CT machine during the procedure. Contrast dye will be injected into the IV. You might feel warm, or you may get a metallic taste in your mouth. You will be given a medicine called nitroglycerin. This will relax or dilate the arteries in your heart. The table that you are lying on will move into the CT machine tunnel for the scan. The person running the machine will give you instructions while the scans are being done. You may be asked to: Keep your arms above your head. Hold your breath. Stay very still, even if the table is moving. When the scanning is complete, you will be moved out of the machine. The IV will be removed. The procedure may vary among health care providers and hospitals. What can I expect after the procedure? After your procedure, it is common to have: A metallic taste in your mouth from the contrast dye. A feeling of warmth. A headache from the nitroglycerin. Follow these instructions at home: Take over-the-counter and prescription medicines only as told by your health care provider. If you are told, drink enough fluid to keep your urine pale yellow. This will help to flush the contrast dye out of your body. Most people can return to their normal activities right after the procedure. Ask your health care provider what activities are safe for you. It is up to you to get the results of your procedure. Ask your  health care provider, or the department that is doing the procedure, when your results will be ready. Keep all follow-up visits as told by your health care provider. This is important. Contact a health care provider if: You have any symptoms of allergy to the contrast dye. These include: Shortness of breath. Rash or hives. A racing heartbeat. Summary A cardiac CT angiogram is a procedure to look at the heart and the area around the heart. It may be done to help find the cause of chest pains or other symptoms of heart disease. During this procedure, a large X-ray machine, called a CT scanner, takes detailed pictures of the heart and the surrounding area after a contrast dye has been injected into blood vessels in the area. Ask your health care provider about changing or stopping your regular medicines before the procedure. This is especially important if you are taking diabetes medicines, blood thinners, or medicines  to treat erectile dysfunction. If you are told, drink enough fluid to keep your urine pale yellow. This will help to flush the contrast dye out of your body. This information is not intended to replace advice given to you by your health care provider. Make sure you discuss any questions you have with your health care provider. Document Revised: 03/26/2019 Document Reviewed: 03/26/2019 Elsevier Patient Education  [place].

## 2021-04-15 NOTE — Telephone Encounter (Signed)
Patient having Cardiac CT and will need Corlanor 5 mg 2 tablets prior to Cardiac CT  They are called to [facility] and patient can pick up or have them mailed to her Cost is $20 for the 2 tablets vs $40 elsewhere  Left message to call back

## 2021-04-15 NOTE — Progress Notes (Signed)
Cardiology Office Note:    Date:  04/16/2021   ID:  Sabrina Richardson, DOB 17-Dec-1980, MRN [id number]  PCP:  [name], MD  Cardiologist:  None  Nephrologist:  Referring MD: [name], MD   CC: Hypertension  History of Present Illness:    Sabrina Richardson is a 40 y.o. female with a hx of PCOS and  gestational ahypertension here for follow-up. She was first seen for hypertension 01/2020. Ms. Sabrina Richardson delivered her first child on 11/2019.  At the time of delivery her labs were notable for creatinine elevated to 1.69 and AST elevated to 52.  These labs normalized prior to discharge.  There is some concern that she may have had an atypical preeclampsia picture.  There is no noted elevated blood pressures at the time of delivery or during pregnancy.  At her 6-week post delivery appointment her blood pressure was initially 130/90.  They rechecked it was 135/105, followed by 151/107.  She was started on nifedipine 30 mg and her blood pressure has been well-controlled. She followed up with the pharmacist the next month and nifedipine was discontinued. Her blood pressure remained controlled after stopping the medication. She called the office because of chest tightness that occurred when working out.  Today, she reports having intermittent chest tightening, which has increased in frequency in the last several months. This mostly occurs when she is trying to work out. For exercise she was trying to run more often and do low-impact Zumba. She no longer runs as she is concerned about pushing through the discomfort. She believes these episodes occurred before her pregnancy, but not as often. Additionally, she experiences episodes of a quick constriction in her central chest. This is notably different from the chest tightening, with a typically duration of one or two seconds. Symptoms associated with the constricting episodes include shortness of breath and a forced cough. This will usually occur while sitting at work,  about once every other week. For her diet, caffeine is limited to ginger ale. Lately, she is mainly stressed from being a new parent and returning to work. She denies any palpitations. No lightheadedness, headaches, syncope, orthopnea, or PND. Also has no lower extremity edema.   Past Medical History:  Diagnosis Date   Atypical chest pain 04/16/2021   Chlamydia contact, treated 2009   Elevated blood pressure reading 01/20/2020   Migraines 2007   per pt none now   Palpitations 04/15/2021   PCOS (polycystic ovarian syndrome)     Past Surgical History:  Procedure Laterality Date   CESAREAN SECTION N/A 11/18/2019   Procedure: CESAREAN SECTION;  Surgeon: Maxie Better, MD;  Location: [facility] LD ORS;  Service: Obstetrics;  Laterality: N/A;   NO PAST SURGERIES      Current Medications: Current Meds  Medication Sig   ivabradine (CORLANOR) 5 MG TABS tablet TAKE 2 TABLETS 2 HOURS PRIOR TO CT   norethindrone-ethinyl estradiol (JUNEL FE 1/20) 1-20 MG-MCG tablet Take 1 tablet by mouth daily.     Allergies:   Patient has no known allergies.   Social History   Socioeconomic History   Marital status: Married    Spouse name: Sabrina Richardson   Number of children: Not on file   Years of education: Not on file   Highest education level: Not on file  Occupational History   Not on file  Tobacco Use   Smoking status: Never   Smokeless tobacco: Never  Vaping Use   Vaping Use: Never used  Substance and Sexual Activity  Alcohol use: No    Alcohol/week: 0.0 standard drinks   Drug use: No   Sexual activity: Yes    Partners: Male    Birth control/protection: None    Comment: 1st intercourse- 85, partners- 1, married- 7 yrs  Other Topics Concern   Not on file  Social History Narrative   Not on file   Social Determinants of Health   Financial Resource Strain: Not on file  Food Insecurity: Not on file  Transportation Needs: Not on file  Physical Activity: Not on file  Stress: Not on file  Social  Connections: Not on file     Family History: The patient's family history includes Diabetes in her maternal grandmother, mother, and paternal grandfather; Heart attack in her maternal uncle; Hyperlipidemia in her father; Hypertension in her father and mother; Stroke in her paternal grandfather.  ROS:   Please see the history of present illness.    (+) Chest tightening/constriction (+) Shortness of breath (+) Cough (+) Stress All other systems reviewed and are negative.  EKGs/Labs/Other Studies Reviewed:    EKG:   04/15/2021: Sinus rhythm. Rate 74 bpm. 01/19/2020: sinus rhythm.  Rate 91 bpm.  Low voltage  Recent Labs: No results found for requested labs within last 8760 hours.   Recent Lipid Panel    Component Value Date/Time   CHOL 160 02/15/2018 1406   TRIG 63 02/15/2018 1406   HDL 52 02/15/2018 1406   CHOLHDL 3.1 02/15/2018 1406   CHOLHDL 3.1 12/18/2013 1421   VLDL 13 12/18/2013 1421   LDLCALC 95 02/15/2018 1406    Physical Exam:    Wt Readings from Last 3 Encounters:  04/15/21 120 lb 9.6 oz (54.7 kg)  11/25/20 116 lb (52.6 kg)  02/19/20 128 lb (58.1 kg)     VS:  BP 104/78   Pulse 74   Ht 5\' 1"  (1.549 m)   Wt 120 lb 9.6 oz (54.7 kg)   BMI 22.79 kg/m  , BMI Body mass index is 22.79 kg/m. GENERAL:  Well appearing.  No acute distress HEENT: Pupils equal round and reactive, fundi not visualized, oral mucosa unremarkable NECK:  No jugular venous distention, waveform within normal limits, carotid upstroke brisk and symmetric, no bruits, no thyromegaly LYMPHATICS:  No cervical adenopathy LUNGS:  Clear to auscultation bilaterally HEART:  RRR.  PMI not displaced or sustained,S1 and S2 within normal limits, no S3, no S4, no clicks, no rubs, no murmurs ABD:  Flat, positive bowel sounds normal in frequency in pitch, no bruits, no rebound, no guarding, no midline pulsatile mass, no hepatomegaly, no splenomegaly EXT:  2 plus pulses throughout, no edema, no cyanosis no  clubbing SKIN:  No rashes no nodules NEURO:  Cranial nerves II through XII grossly intact, motor grossly intact throughout PSYCH:  Cognitively intact, oriented to person place and time  ASSESSMENT:    1. Chest pain of uncertain etiology   2. Palpitations   3. Atypical chest pain   4. Elevated blood pressure reading      PLAN:   Palpitations It sounds like she is having PACs or PVCs.  Episodes occur every couple weeks.    Atypical chest pain Atypical chest pain that is mostly non-exertional.  However it is limiting her ability to exercise.  We will get a coronary CT-A to evaluate for obstructive CAD as well as any coronary anomalies.    Elevated blood pressure reading BP has normalized.  No  Medications required.     Disposition:  FU with Temitope Flammer C. Duke Salvia, MD, [facility] in 2-3 months.   Medication Adjustments/Labs and Tests Ordered: Current medicines are reviewed at length with the patient today.  Concerns regarding medicines are outlined above.  Orders Placed This Encounter  Procedures   CT CORONARY MORPH W/CTA COR W/SCORE W/CA W/CM &/OR WO/CM   TSH   Magnesium   Basic metabolic panel   LONG TERM MONITOR (3-14 DAYS)   EKG 12-Lead    Meds ordered this encounter  Medications   ivabradine (CORLANOR) 5 MG TABS tablet    Sig: TAKE 2 TABLETS 2 HOURS PRIOR TO CT    Dispense:  2 tablet    Refill:  0    I,Mathew Stumpf,acting as a scribe for Chilton Si, MD.,have documented all relevant documentation on the behalf of Chilton Si, MD,as directed by  Chilton Si, MD while in the presence of Chilton Si, MD.  I, Gicela Schwarting C. Duke Salvia, MD have reviewed all documentation for this visit.  The documentation of the exam, diagnosis, procedures, and orders on 04/16/2021 are all accurate and complete.   Signed, Chilton Si, MD  04/16/2021 12:26 PM    [facility]

## 2021-04-15 NOTE — Assessment & Plan Note (Signed)
It sounds like she is having PACs or PVCs.  Episodes occur every couple weeks.

## 2021-04-16 ENCOUNTER — Encounter (HOSPITAL_BASED_OUTPATIENT_CLINIC_OR_DEPARTMENT_OTHER): Payer: Self-pay | Admitting: Cardiovascular Disease

## 2021-04-16 DIAGNOSIS — R0789 Other chest pain: Secondary | ICD-10-CM

## 2021-04-16 HISTORY — DX: Other chest pain: R07.89

## 2021-04-16 NOTE — Assessment & Plan Note (Signed)
Atypical chest pain that is mostly non-exertional.  However it is limiting her ability to exercise.  We will get a coronary CT-A to evaluate for obstructive CAD as well as any coronary anomalies.

## 2021-04-16 NOTE — Assessment & Plan Note (Signed)
BP has normalized.  No  Medications required.

## 2021-04-19 NOTE — Telephone Encounter (Signed)
Returned call to patient of Dr. Duke Salvia  Reviewed with her Melinda's note and provided her the pharmacy phone #

## 2021-04-19 NOTE — Telephone Encounter (Signed)
Patient is returning call.  °

## 2021-04-20 ENCOUNTER — Encounter (HOSPITAL_BASED_OUTPATIENT_CLINIC_OR_DEPARTMENT_OTHER): Payer: Self-pay

## 2021-04-23 ENCOUNTER — Other Ambulatory Visit (HOSPITAL_COMMUNITY): Payer: Self-pay

## 2021-04-25 ENCOUNTER — Ambulatory Visit (HOSPITAL_COMMUNITY): Payer: BC Managed Care – PPO

## 2021-04-26 ENCOUNTER — Other Ambulatory Visit (HOSPITAL_COMMUNITY): Payer: Self-pay

## 2021-04-26 LAB — BASIC METABOLIC PANEL
BUN/Creatinine Ratio: 13 (ref 9–23)
BUN: 13 mg/dL (ref 6–24)
CO2: 23 mmol/L (ref 20–29)
Calcium: 9.6 mg/dL (ref 8.7–10.2)
Chloride: 101 mmol/L (ref 96–106)
Creatinine, Ser: 1.04 mg/dL — ABNORMAL HIGH (ref 0.57–1.00)
Glucose: 71 mg/dL (ref 65–99)
Potassium: 4.6 mmol/L (ref 3.5–5.2)
Sodium: 137 mmol/L (ref 134–144)
eGFR: 70 mL/min/{1.73_m2} (ref >59)

## 2021-04-26 LAB — MAGNESIUM: Magnesium: 2.2 mg/dL (ref 1.6–2.3)

## 2021-04-26 LAB — TSH: TSH: 1.28 u[IU]/mL (ref 0.450–4.500)

## 2021-04-28 ENCOUNTER — Telehealth (HOSPITAL_COMMUNITY): Payer: Self-pay | Admitting: Emergency Medicine

## 2021-04-28 NOTE — Telephone Encounter (Signed)
Reaching out to patient to offer assistance regarding upcoming cardiac imaging study; pt verbalizes understanding of appt date/time, parking situation and where to check in, pre-test NPO status and medications ordered, and verified current allergies; name and call back number provided for further questions should they arise Rockwell Alexandria RN Navigator Cardiac Imaging [facility] [phone] office [phone] cell   10mg  ivab  Denies iv issues Denies claustro

## 2021-05-02 ENCOUNTER — Other Ambulatory Visit: Payer: Self-pay

## 2021-05-02 ENCOUNTER — Ambulatory Visit (HOSPITAL_COMMUNITY)
Admission: RE | Admit: 2021-05-02 | Discharge: 2021-05-02 | Disposition: A | Discharge status: Home / Self Care | Payer: BC Managed Care – PPO | Source: Ambulatory Visit | Attending: Cardiovascular Disease | Admitting: Cardiovascular Disease

## 2021-05-02 DIAGNOSIS — R079 Chest pain, unspecified: Secondary | ICD-10-CM | POA: Diagnosis not present

## 2021-05-02 DIAGNOSIS — R002 Palpitations: Secondary | ICD-10-CM | POA: Insufficient documentation

## 2021-05-02 MED ORDER — METOPROLOL TARTRATE 5 MG/5ML IV SOLN: 5.0000 mg | INTRAVENOUS | Status: DC | PRN

## 2021-05-02 MED ORDER — NITROGLYCERIN 0.4 MG SL SUBL
0.8000 mg | SUBLINGUAL_TABLET | Freq: Once | SUBLINGUAL | Status: AC
  Administered 2021-05-02: 0.8 mg via SUBLINGUAL

## 2021-05-02 MED ORDER — IOHEXOL 350 MG/ML SOLN
95.0000 mL | Freq: Once | INTRAVENOUS | Status: AC | PRN
  Administered 2021-05-02: 95 mL via INTRAVENOUS

## 2021-05-02 MED ORDER — METOPROLOL TARTRATE 5 MG/5ML IV SOLN
INTRAVENOUS | Status: AC
  Administered 2021-05-02: 5 mg via INTRAVENOUS
  Filled 2021-05-02: qty 10

## 2021-05-02 MED ORDER — NITROGLYCERIN 0.4 MG SL SUBL
SUBLINGUAL_TABLET | SUBLINGUAL | Status: AC
  Filled 2021-05-02: qty 2

## 2021-06-07 ENCOUNTER — Other Ambulatory Visit: Payer: Self-pay | Admitting: Family Medicine

## 2021-06-07 DIAGNOSIS — Z1231 Encounter for screening mammogram for malignant neoplasm of breast: Secondary | ICD-10-CM

## 2021-06-10 DIAGNOSIS — Z1231 Encounter for screening mammogram for malignant neoplasm of breast: Secondary | ICD-10-CM

## 2021-07-06 ENCOUNTER — Ambulatory Visit
Admission: RE | Admit: 2021-07-06 | Discharge: 2021-07-06 | Disposition: A | Discharge status: Home / Self Care | Payer: BC Managed Care – PPO | Source: Ambulatory Visit

## 2021-07-06 ENCOUNTER — Other Ambulatory Visit: Payer: Self-pay

## 2021-07-06 DIAGNOSIS — Z1231 Encounter for screening mammogram for malignant neoplasm of breast: Secondary | ICD-10-CM

## 2021-07-18 ENCOUNTER — Ambulatory Visit (HOSPITAL_BASED_OUTPATIENT_CLINIC_OR_DEPARTMENT_OTHER): Payer: BC Managed Care – PPO | Admitting: Cardiovascular Disease

## 2021-07-18 ENCOUNTER — Other Ambulatory Visit: Payer: Self-pay

## 2021-07-18 ENCOUNTER — Encounter (HOSPITAL_BASED_OUTPATIENT_CLINIC_OR_DEPARTMENT_OTHER): Payer: Self-pay | Admitting: Cardiovascular Disease

## 2021-07-18 DIAGNOSIS — R0789 Other chest pain: Secondary | ICD-10-CM

## 2021-07-18 DIAGNOSIS — R002 Palpitations: Secondary | ICD-10-CM

## 2021-07-18 DIAGNOSIS — R030 Elevated blood-pressure reading, without diagnosis of hypertension: Secondary | ICD-10-CM | POA: Diagnosis not present

## 2021-07-18 NOTE — Assessment & Plan Note (Signed)
Resolved.  No CAD noted on coronary CT-A.

## 2021-07-18 NOTE — Progress Notes (Signed)
Cardiology Office Note:    Date:  07/18/2021   ID:  Sabrina Richardson, DOB [birth year], MRN [id number]  PCP:  [name], MD  Cardiologist:  None  Nephrologist:  Referring MD: [name], MD   CC: Hypertension  History of Present Illness:    Sabrina Richardson is a 40 y.o. female with a hx of PCOS and  gestational ahypertension here for follow-up. She was first seen for hypertension 01/2020. Ms. Sabrina Richardson delivered her first child on 11/2019.  At the time of delivery her labs were notable for creatinine elevated to 1.69 and AST elevated to 52.  These labs normalized prior to discharge.  There is some concern that she may have had an atypical preeclampsia picture.  There is no noted elevated blood pressures at the time of delivery or during pregnancy.  At her 6-week post delivery appointment her blood pressure was initially 130/90.  They rechecked it was 135/105, followed by 151/107.  She was started on nifedipine 30 mg and her blood pressure has been well-controlled. She followed up with the pharmacist the next month and nifedipine was discontinued. Her blood pressure remained controlled after stopping the medication. She called the office because of chest tightness that occurred when working out.  At her last appointment, she complained of intermittent chest tightening, which increased in frequency in the past several months and occurred while working out. She also complained of a notable different chest constriction lasting 1-2 seconds with associated shortness of breath and cough occurring once every other week at work. She was referred for  coronary CTA  04/2021 which showed normal coronaries and a calcium score of 0.   Today, she has been doing well. She has been better able to identify her stressors after her monitor event and is better managing her stress and chest pain. She has been walking recently. She has been nervous to exercise because of a cough and palpitations that worsened with  exertion. Now that she better understands her stressors for her chest pain, she wants to exercise more regularly. She has a 32-month old. She has not been checking her blood pressure at home since her pregnancy. She denies any shortness of breath, lightheadedness, headaches, syncope, orthopnea, PND, lower extremity edema or exertional symptoms.  Past Medical History:  Diagnosis Date   Atypical chest pain 04/16/2021   Chlamydia contact, treated 2009   Elevated blood pressure reading 01/20/2020   Migraines 2007   per pt none now   Palpitations 04/15/2021   PCOS (polycystic ovarian syndrome)     Past Surgical History:  Procedure Laterality Date   CESAREAN SECTION N/A 11/18/2019   Procedure: CESAREAN SECTION;  Surgeon: Maxie Better, MD;  Location: [facility];  Service: Obstetrics;  Laterality: N/A;   NO PAST SURGERIES      Current Medications:  Current Meds  Medication Sig   norethindrone-ethinyl estradiol (JUNEL FE 1/20) 1-20 MG-MCG tablet Take 1 tablet by mouth daily.     Allergies:   Patient has no known allergies.   Social History   Socioeconomic History   Marital status: Married    Spouse name: [name]   Number of children: Not on file   Years of education: Not on file   Highest education level: Not on file  Occupational History   Not on file  Tobacco Use   Smoking status: Never   Smokeless tobacco: Never  Vaping Use   Vaping Use: Never used  Substance and Sexual Activity   Alcohol use: No  Alcohol/week: 0.0 standard drinks   Drug use: No   Sexual activity: Yes    Partners: Male    Birth control/protection: None    Comment: 1st intercourse- 74, partners- 1, married- 7 yrs  Other Topics Concern   Not on file  Social History Narrative   Not on file   Social Determinants of Health   Financial Resource Strain: Not on file  Food Insecurity: Not on file  Transportation Needs: Not on file  Physical Activity: Not on file  Stress: Not on file  Social  Connections: Not on file     Family History: The patient's family history includes Diabetes in her maternal grandmother, mother, and paternal grandfather; Heart attack in her maternal uncle; Hyperlipidemia in her father; Hypertension in her father and mother; Stroke in her paternal grandfather. There is no history of Breast cancer.  ROS:   Please see the history of present illness.    (+) Chest pain (+) Palpitations (+) Cough All other systems reviewed and are negative.  EKGs/Labs/Other Studies Reviewed:    Monitor 05/03/21 14 Day Zio Monitor Quality: Fair.  Baseline artifact. Predominant rhythm: sinus rhythm Average heart rate: 79 bpm Max heart rate: 151 bpm Min heart rate: 42 bpm Pauses >2.5 seconds: none Occasional PVCs  CT Coronary 05/02/21 1. Calcium score 0 2.  Normal right dominant coronary arteries 3.  Normal aortic root 2.5 cm No significant incidental findings  EKG:  EKG was not ordered today 04/15/2021: Sinus rhythm. Rate 74 bpm. 01/19/2020: sinus rhythm.  Rate 91 bpm.  Low voltage  Recent Labs: 04/25/2021: BUN 13; Creatinine, Ser 1.04; Magnesium 2.2; Potassium 4.6; Sodium 137; TSH 1.280   Recent Lipid Panel    Component Value Date/Time   CHOL 160 02/15/2018 1406   TRIG 63 02/15/2018 1406   HDL 52 02/15/2018 1406   CHOLHDL 3.1 02/15/2018 1406   CHOLHDL 3.1 12/18/2013 1421   VLDL 13 12/18/2013 1421   LDLCALC 95 02/15/2018 1406    Physical Exam:    VS:  BP 122/84   Pulse 80   Ht 5\' 1"  (1.549 m)   Wt 121 lb 3.2 oz (55 kg)   LMP 07/05/2021   SpO2 97%   BMI 22.90 kg/m  , BMI Body mass index is 22.9 kg/m. GENERAL:  Well appearing HEENT: Pupils equal round and reactive, fundi not visualized, oral mucosa unremarkable NECK:  No jugular venous distention, waveform within normal limits, carotid upstroke brisk and symmetric, no bruits, no thyromegaly LYMPHATICS:  No cervical adenopathy LUNGS:  Clear to auscultation bilaterally HEART:  RRR.  PMI not displaced  or sustained,S1 and S2 within normal limits, no S3, no S4, no clicks, no rubs, no murmurs ABD:  Flat, positive bowel sounds normal in frequency in pitch, no bruits, no rebound, no guarding, no midline pulsatile mass, no hepatomegaly, no splenomegaly EXT:  2 plus pulses throughout, no edema, no cyanosis no clubbing SKIN:  No rashes no nodules NEURO:  Cranial nerves II through XII grossly intact, motor grossly intact throughout PSYCH:  Cognitively intact, oriented to person place and time   ASSESSMENT:    1. Palpitations   2. Atypical chest pain   3. Elevated blood pressure reading       PLAN:   Palpitations PVCs noted on monitor.  She is reassured after monitoring and cleared to start back exercising.  Monitor results reviewed in clinic.   Atypical chest pain Resolved.  No CAD noted on coronary CT-A.  Elevated blood pressure reading  BP elevated during pregnancy.  She understands that this increases her risk of hypertension later and will periodically monitor her BP.    Disposition:    FU with Sammantha Mehlhaff C. Duke Salvia, MD, [facility] as needed   Medication Adjustments/Labs and Tests Ordered: Current medicines are reviewed at length with the patient today.  Concerns regarding medicines are outlined above.  No orders of the defined types were placed in this encounter.   No orders of the defined types were placed in this encounter.  I,Mykaella Javier,acting as a scribe for Chilton Si, MD.,have documented all relevant documentation on the behalf of Chilton Si, MD,as directed by  Chilton Si, MD while in the presence of Chilton Si, MD.  I, Netanel Yannuzzi C. Duke Salvia, MD have reviewed all documentation for this visit.  The documentation of the exam, diagnosis, procedures, and orders on 07/18/2021 are all accurate and complete.   Signed, Chilton Si, MD  07/18/2021 9:05 AM    [facility]

## 2021-07-18 NOTE — Patient Instructions (Signed)
Medication Instructions:  Continue current medications  *If you need a refill on your cardiac medications before your next appointment, please call your pharmacy*   Lab Work: None Ordered   Testing/Procedures: None Ordered   Follow-Up: At [facility], you and your health needs are our priority.  As part of our continuing mission to provide you with exceptional heart care, we have created designated Provider Care Teams.  These Care Teams include your primary Cardiologist (physician) and Advanced Practice Providers (APPs -  Physician Assistants and Nurse Practitioners) who all work together to provide you with the care you need, when you need it.  We recommend signing up for the patient portal called "MyChart".  Sign up information is provided on this After Visit Summary.  MyChart is used to connect with patients for Virtual Visits (Telemedicine).  Patients are able to view lab/test results, encounter notes, upcoming appointments, etc.  Non-urgent messages can be sent to your provider as well.   To learn more about what you can do with MyChart, go to [email or web address].    Your next appointment:   As Needed  

## 2021-07-18 NOTE — Assessment & Plan Note (Addendum)
PVCs noted on monitor.  She is reassured after monitoring and cleared to start back exercising.  Monitor results reviewed in clinic.

## 2021-07-18 NOTE — Assessment & Plan Note (Signed)
BP elevated during pregnancy.  She understands that this increases her risk of hypertension later and will periodically monitor her BP.

## 2021-11-30 ENCOUNTER — Ambulatory Visit: Payer: BC Managed Care – PPO | Admitting: Obstetrics & Gynecology

## 2021-12-21 ENCOUNTER — Ambulatory Visit (INDEPENDENT_AMBULATORY_CARE_PROVIDER_SITE_OTHER): Payer: BC Managed Care – PPO | Admitting: Obstetrics & Gynecology

## 2021-12-21 ENCOUNTER — Encounter: Payer: Self-pay | Admitting: Obstetrics & Gynecology

## 2021-12-21 VITALS — BP 102/64 | HR 68 | Resp 16 | Ht 60.75 in | Wt 127.0 lb

## 2021-12-21 DIAGNOSIS — Z3041 Encounter for surveillance of contraceptive pills: Secondary | ICD-10-CM | POA: Diagnosis not present

## 2021-12-21 DIAGNOSIS — Z01419 Encounter for gynecological examination (general) (routine) without abnormal findings: Secondary | ICD-10-CM | POA: Diagnosis not present

## 2021-12-21 DIAGNOSIS — R1031 Right lower quadrant pain: Secondary | ICD-10-CM | POA: Diagnosis not present

## 2021-12-21 MED ORDER — NORETHIN ACE-ETH ESTRAD-FE 1-20 MG-MCG PO TABS: 1.0000 | ORAL_TABLET | Freq: Every day | ORAL | 4 refills | Status: DC

## 2021-12-21 NOTE — Progress Notes (Addendum)
? ? ?Sabrina Richardson 12-29-1980 [id number] ? ? ?History:    41 y.o. G13P1L1 Son is 2 yo.  C/S 11/2019. ?  ?RP:  Established patient presenting for annual gyn exam  ?  ?HPI: Well on Junel Fe 1/20.  No BTB.  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  C/S for arrest of progression.  Rt intermittent lower abdominal pain with radiation to the right buttocks/back of the Rt thigh.  Pap Neg 11/2020.  Pap reflex done.  Breasts normal.  Screening mammo 06/2021 Neg.  BMI 24.19.  Health labs with Fam MD.  Alen Bleacher 2023 Polyp removed.  Fam h/o Colon Ca. ?  ? ?Past medical history,surgical history, family history and social history were all reviewed and documented in the EPIC chart. ? ?Gynecologic History ?Patient's last menstrual period was 12/20/2021. ? ?Obstetric History ?OB History  ?Gravida Para Term Preterm AB Living  ?1 1 1     1   ?SAB IAB Ectopic Multiple Live Births  ?      0 1  ?  ?# Outcome Date GA Lbr Len/2nd Weight Sex Delivery Anes PTL Lv  ?1 Term 11/18/19 [redacted]w[redacted]d  7 lb 12 oz (3.515 kg) M CS-LTranv EPI  LIV  ? ? ? ?ROS: A ROS was performed and pertinent positives and negatives are included in the history. ?GENERAL: No fevers or chills. HEENT: No change in vision, no earache, sore throat or sinus congestion. NECK: No pain or stiffness. CARDIOVASCULAR: No chest pain or pressure. No palpitations. PULMONARY: No shortness of breath, cough or wheeze. GASTROINTESTINAL: No abdominal pain, nausea, vomiting or diarrhea, melena or bright red blood per rectum. GENITOURINARY: No urinary frequency, urgency, hesitancy or dysuria. MUSCULOSKELETAL: No joint or muscle pain, no back pain, no recent trauma. DERMATOLOGIC: No rash, no itching, no lesions. ENDOCRINE: No polyuria, polydipsia, no heat or cold intolerance. No recent change in weight. HEMATOLOGICAL: No anemia or easy bruising or bleeding. NEUROLOGIC: No headache, seizures, numbness, tingling or weakness. PSYCHIATRIC: No depression, no loss of interest in normal  activity or change in sleep pattern.  ?  ? ?Exam: ? ? ?BP 102/64   Pulse 68   Resp 16   Ht 5' 0.75" (1.543 m)   Wt 127 lb (57.6 kg)   LMP 12/20/2021 Comment: ocp  BMI 24.19 kg/m?  ? ?Body mass index is 24.19 kg/m?. ? ?General appearance : Well developed well nourished female. No acute distress ?HEENT: Eyes: no retinal hemorrhage or exudates,  Neck supple, trachea midline, no carotid bruits, no thyroidmegaly ?Lungs: Clear to auscultation, no rhonchi or wheezes, or rib retractions  ?Heart: Regular rate and rhythm, no murmurs or gallops ?Breast:Examined in sitting and supine position were symmetrical in appearance, no palpable masses or tenderness,  no skin retraction, no nipple inversion, no nipple discharge, no skin discoloration, no axillary or supraclavicular lymphadenopathy ?Abdomen: no palpable masses or tenderness, no rebound or guarding ?Extremities: no edema or skin discoloration or tenderness ? ?Pelvic: Vulva: Normal ?            Vagina: No gross lesions or discharge ? Cervix: No gross lesions or discharge.  Pap reflex done. ? Uterus  AV, normal size, shape and consistency, non-tender and mobile ? Adnexa  Without masses or tenderness ? Anus: Normal ? ? ?Assessment/Plan:  41 y.o. female for annual exam  ? ?1. Encounter for routine gynecological examination with Papanicolaou smear of cervix ?Well on Junel Fe 1/20.  No BTB.  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  C/S for arrest of progression.  No pelvic pain.  Breasts normal.  Screening mammo 06/2021 Neg.  BMI 24.19.  Health labs with Fam MD.  Alen Bleacher 2023 Polyp removed.  Fam h/o Colon Ca. ?-Pap reflex ? ?2. Encounter for surveillance of contraceptive pills ?Well on Junel Fe 1/20.  No BTB.  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 No CI to continue.  Prescription sent to pharmacy. ? ?3. Intermittent right lower quadrant abdominal pain ?Sciatic type Rt pain with radiation to the Rt buttocks and back thigh.  Will  consult with Ortho as needed.  R/O Rt pelvic origin of pain with a Pelvic US at f/u. ?- US Transvaginal Non-OB; Future ? ?Other orders ?- cetirizine (ZYRTEC) 10 MG tablet; Take 10 mg by mouth daily. ?- norethindrone-ethinyl estradiol-FE (JUNEL FE 1/20) 1-20 MG-MCG tablet; Take 1 tablet by mouth daily.  ? ?Genia Del MD, 4:38 PM 12/21/2021 ? ?  ?

## 2021-12-22 ENCOUNTER — Other Ambulatory Visit (HOSPITAL_COMMUNITY)
Admission: RE | Admit: 2021-12-22 | Discharge: 2021-12-22 | Disposition: A | Discharge status: Home / Self Care | Payer: BC Managed Care – PPO | Source: Ambulatory Visit | Attending: Obstetrics & Gynecology | Admitting: Obstetrics & Gynecology

## 2021-12-22 DIAGNOSIS — Z01419 Encounter for gynecological examination (general) (routine) without abnormal findings: Secondary | ICD-10-CM | POA: Insufficient documentation

## 2021-12-22 NOTE — Addendum Note (Signed)
Addended by: Genia Del on: 12/22/2021 08:46 AM ? ? Modules accepted: Orders ? ?

## 2021-12-23 LAB — CYTOLOGY - PAP: Diagnosis: NEGATIVE

## 2022-02-09 ENCOUNTER — Encounter: Payer: Self-pay | Admitting: Obstetrics & Gynecology

## 2022-02-09 ENCOUNTER — Ambulatory Visit (INDEPENDENT_AMBULATORY_CARE_PROVIDER_SITE_OTHER): Payer: BC Managed Care – PPO | Admitting: Obstetrics & Gynecology

## 2022-02-09 ENCOUNTER — Telehealth: Payer: Self-pay | Admitting: *Deleted

## 2022-02-09 ENCOUNTER — Ambulatory Visit (INDEPENDENT_AMBULATORY_CARE_PROVIDER_SITE_OTHER): Payer: BC Managed Care – PPO

## 2022-02-09 VITALS — BP 110/72

## 2022-02-09 DIAGNOSIS — M5431 Sciatica, right side: Secondary | ICD-10-CM

## 2022-02-09 DIAGNOSIS — R1031 Right lower quadrant pain: Secondary | ICD-10-CM

## 2022-02-09 NOTE — Telephone Encounter (Signed)
Referral placed at [facility], they will call to schedule.

## 2022-02-09 NOTE — Telephone Encounter (Signed)
-----   Message from Genia Del, MD sent at 02/09/2022 12:34 PM EDT ----- Regarding: Refer to Orthopedist Sciatic type Rt pain with radiation to the Rt buttocks and back thigh. Pelvic US Neg today.  Please refer to Ortho.

## 2022-02-10 ENCOUNTER — Encounter: Payer: Self-pay | Admitting: Obstetrics & Gynecology

## 2022-02-10 NOTE — Progress Notes (Signed)
    Sabrina Richardson 1980/08/26 [id number]        41 y.o.  G1P1001   RP: Intermittent RLQ pain for Pelvic US  HPI: Rt intermittent lower abdominal pain with radiation to the right buttocks/back of the Rt thigh.  Well on Junel Fe 1/20.  No BTB.     OB History  Gravida Para Term Preterm AB Living  1 1 1     1   SAB IAB Ectopic Multiple Live Births        0 1    # Outcome Date GA Lbr Len/2nd Weight Sex Delivery Anes PTL Lv  1 Term 11/18/19 [redacted]w[redacted]d  7 lb 12 oz (3.515 kg) M CS-LTranv EPI  LIV    Past medical history,surgical history, problem list, medications, allergies, family history and social history were all reviewed and documented in the EPIC chart.   Directed ROS with pertinent positives and negatives documented in the history of present illness/assessment and plan.  Exam:  Vitals:   02/09/22 1204  BP: 110/72   General appearance:  Normal  Pelvic 02/11/22 today: T/V images.  Anteverted uterus normal in size and shape with no myometrial mass.  The uterus is measured at 6.38 x 3.56 x 2.68 cm.  The endometrial lining is thin and symmetrical measured at 2.4 mm.  The cavity is partially shadowed out by patient's C-section scar.  No obvious mass or thickening seen.  The right ovary is within normal.  Positive perfusion to the right ovary.  The left ovary is slightly enlarged compatible with history of PCOS, but normal in appearance with positive perfusion to the left ovary.   Assessment/Plan:  41 y.o. G1P1001   1. Intermittent right lower quadrant abdominal pain  Rt intermittent lower abdominal pain with radiation to the right buttocks/back of the Rt thigh.  Well on Junel Fe 1/20.  No BTB.  Pelvic 2/20 findings thoroughly reviewed with patient.  Reassured that her uterus and ovaries are within normal limits.  Left ovary slightly enlarged c/w h/o PCOS, but no concern. No FF in the pelvis.  Patient's intermittent RLQ pain with radiation to the Rt buttocks and back of the Rt thigh is probably a  sciatic nerve pain.  Will refer to Ortho for investigation and management with PT.  Patient agrees with the plan.  Counseling on pelvic pain with radiation to the right buttocks and back of the right thigh in the light of a normal pelvic ultrasound, probable sciatic pain, documentation reviewed, for 15 minutes.  [name], 4:20 PM 02/10/2022

## 2022-02-15 ENCOUNTER — Ambulatory Visit (INDEPENDENT_AMBULATORY_CARE_PROVIDER_SITE_OTHER): Payer: BC Managed Care – PPO

## 2022-02-15 ENCOUNTER — Ambulatory Visit (HOSPITAL_BASED_OUTPATIENT_CLINIC_OR_DEPARTMENT_OTHER): Payer: BC Managed Care – PPO | Admitting: Orthopaedic Surgery

## 2022-02-15 DIAGNOSIS — M5416 Radiculopathy, lumbar region: Secondary | ICD-10-CM | POA: Diagnosis not present

## 2022-02-15 DIAGNOSIS — M25551 Pain in right hip: Secondary | ICD-10-CM | POA: Diagnosis not present

## 2022-02-15 NOTE — Progress Notes (Signed)
Chief Complaint: Back pain with radiation to the right leg     History of Present Illness:    Sabrina Richardson is a 41 y.o. female presents with pain for approximately 1 year that is a shooting pain down the right leg.  She states that this goes down the right posterior aspect of the gluteus and she is now experiencing numbness in the anterior thigh.  She experiences a sense of tearing type pain that she experiences in the posterior buttock.  She denies any specific injury or incident.  She states that after the birth of her first child she did start to feel this more.  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    Surgical History:   None  PMH/PSH/Family History/Social History/Meds/Allergies:    Past Medical History:  Diagnosis Date   Atypical chest pain 04/16/2021   Chlamydia contact, treated 2009   Elevated blood pressure reading 01/20/2020   Migraines 2007   per pt none now   Palpitations 04/15/2021   PCOS (polycystic ovarian syndrome)    Past Surgical History:  Procedure Laterality Date   CESAREAN SECTION N/A 11/18/2019   Procedure: CESAREAN SECTION;  Surgeon: Maxie Better, MD;  Location: [facility];  Service: Obstetrics;  Laterality: N/A;   NO PAST SURGERIES     Social History   Socioeconomic History   Marital status: Married    Spouse name: [name]   Number of children: Not on file   Years of education: Not on file   Highest education level: Not on file  Occupational History   Not on file  Tobacco Use   Smoking status: Never   Smokeless tobacco: Never  Vaping Use   Vaping Use: Never used  Substance and Sexual Activity   Alcohol use: No    Alcohol/week: 0.0 standard drinks of alcohol   Drug use: No   Sexual activity: Yes    Partners: Male    Birth control/protection: OCP    Comment: 1st intercourse- 45, partners- 1  Other Topics Concern   Not on file  Social History Narrative   Not on file   Social  Determinants of Health   Financial Resource Strain: Not on file  Food Insecurity: Not on file  Transportation Needs: Not on file  Physical Activity: Not on file  Stress: Not on file  Social Connections: Not on file   Family History  Problem Relation Age of Onset   Diabetes Mother    Hypertension Mother    Hypertension Father    Hyperlipidemia Father    Heart attack Maternal Uncle    Diabetes Maternal Grandmother    Diabetes Paternal Grandfather    Stroke Paternal Grandfather    Breast cancer Neg Hx    No Known Allergies Current Outpatient Medications  Medication Sig Dispense Refill   cetirizine (ZYRTEC) 10 MG tablet Take 10 mg by mouth daily.     norethindrone-ethinyl estradiol-FE (JUNEL FE 1/20) 1-20 MG-MCG tablet Take 1 tablet by mouth daily. 84 tablet 4   No current facility-administered medications for this visit.   No results found.  Review of Systems:   A ROS was performed including pertinent positives and negatives as documented in the HPI.  Physical Exam :   Constitutional: NAD and appears stated age Neurological: Alert and oriented  Psych: Appropriate affect and cooperative Last menstrual period 01/19/2022.   Comprehensive Musculoskeletal Exam:    Inspection Right Left  Skin No atrophy or gross abnormalities appreciated No atrophy or gross abnormalities appreciated  Palpation    Tenderness None None  Crepitus None None  Range of Motion    Flexion (passive) 120 120  Extension 30 30  IR 45 no pain 45  ER 45 45  Strength    Flexion  5/5 5/5  Extension 5/5 5/5  Special Tests    FABER Negative Negative  FADIR Negative Negative  ER Lag/Capsular Insufficiency Negative Negative  Instability Negative Negative  Sacroiliac pain Negative  Negative   Instability    Generalized Laxity No No  Neurologic    sciatic, femoral, obturator nerves intact to light sensation  Vascular/Lymphatic    DP pulse 2+ 2+  Lumbar Exam    Patient has symmetric lumbar range of  motion with negative pain referral to hip  Positive straight leg raise   Imaging:   Xray (4 views right hip): Normal   I personally reviewed and interpreted the radiographs.   Assessment:   41 y.o. female with radiating pain down the right leg and now numbness which has been going on for over a year.  At this time given the fact that she is experiencing this I do believe that she would a candidate for an x-ray guided injection.  To that effect I do believe that an MRI is required so that we can localize this to get her maximum efficacy.  We will plan for an MRI of her lumbar spine and follow-up to discuss results  Plan :    -Plan for MRI lumbar spine  I believe that advance imaging in the form of an MRI is indicated for the following reasons: -Xrays images were obtained and not diagnostic -The patient has failed treatment modalities including rest, anti-inflammatories, activity restriction -The following worrisome symptoms are present on history and exam: 1 year of radiating pain down the right lower side with positive straight leg raise       I personally saw and evaluated the patient, and participated in the management and treatment plan.  Huel Cote, MD Attending Physician, Orthopedic Surgery  This document was dictated using Dragon voice recognition software. A reasonable attempt at proof reading has been made to minimize errors.

## 2022-02-16 NOTE — Telephone Encounter (Signed)
Patient scheduled on 03/15/22

## 2022-03-01 ENCOUNTER — Ambulatory Visit
Admission: RE | Admit: 2022-03-01 | Discharge: 2022-03-01 | Disposition: A | Discharge status: Home / Self Care | Payer: BC Managed Care – PPO | Source: Ambulatory Visit | Attending: Orthopaedic Surgery | Admitting: Orthopaedic Surgery

## 2022-03-01 DIAGNOSIS — M5416 Radiculopathy, lumbar region: Secondary | ICD-10-CM

## 2022-03-15 ENCOUNTER — Ambulatory Visit (INDEPENDENT_AMBULATORY_CARE_PROVIDER_SITE_OTHER): Payer: BC Managed Care – PPO | Admitting: Orthopaedic Surgery

## 2022-03-15 DIAGNOSIS — M5459 Other low back pain: Secondary | ICD-10-CM | POA: Diagnosis not present

## 2022-03-15 DIAGNOSIS — M79604 Pain in right leg: Secondary | ICD-10-CM

## 2022-03-15 DIAGNOSIS — S76011A Strain of muscle, fascia and tendon of right hip, initial encounter: Secondary | ICD-10-CM

## 2022-03-15 DIAGNOSIS — M25551 Pain in right hip: Secondary | ICD-10-CM | POA: Diagnosis not present

## 2022-03-15 MED ORDER — TRIAMCINOLONE ACETONIDE 40 MG/ML IJ SUSP
80.0000 mg | INTRAMUSCULAR | Status: AC | PRN
  Administered 2022-03-15: 80 mg via INTRA_ARTICULAR

## 2022-03-15 MED ORDER — LIDOCAINE HCL 1 % IJ SOLN
4.0000 mL | INTRAMUSCULAR | Status: AC | PRN
  Administered 2022-03-15: 4 mL

## 2022-03-15 NOTE — Progress Notes (Signed)
Chief Complaint: Back pain with radiation to the right leg     History of Present Illness:   03/15/2022: Presents today predominantly with lateral sided hip pain.  She is here today for MRI review.  She states that the pain is localized to the lateral aspect of the hip although does occasionally radiate to the buttocks.  Sabrina Richardson is a 41 y.o. female presents with pain for approximately 1 year that is a shooting pain down the right leg.  She states that this goes down the right posterior aspect of the gluteus and she is now experiencing numbness in the anterior thigh.  She experiences a sense of tearing type pain that she experiences in the posterior buttock.  She denies any specific injury or incident.  She states that after the birth of her first child she did start to feel this more.  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    Surgical History:   None  PMH/PSH/Family History/Social History/Meds/Allergies:    Past Medical History:  Diagnosis Date  . Atypical chest pain 04/16/2021  . Chlamydia contact, treated 2009  . Elevated blood pressure reading 01/20/2020  . Migraines 2007   per pt none now  . Palpitations 04/15/2021  . PCOS (polycystic ovarian syndrome)    Past Surgical History:  Procedure Laterality Date  . CESAREAN SECTION N/A 11/18/2019   Procedure: CESAREAN SECTION;  Surgeon: Maxie Better, MD;  Location: [facility];  Service: Obstetrics;  Laterality: N/A;  . NO PAST SURGERIES     Social History   Socioeconomic History  . Marital status: Married    Spouse name: [name]  . Number of children: Not on file  . Years of education: Not on file  . Highest education level: Not on file  Occupational History  . Not on file  Tobacco Use  . Smoking status: Never  . Smokeless tobacco: Never  Vaping Use  . Vaping Use: Never used  Substance and Sexual Activity  . Alcohol use: No    Alcohol/week: 0.0 standard drinks  of alcohol  . Drug use: No  . Sexual activity: Yes    Partners: Male    Birth control/protection: OCP    Comment: 1st intercourse- 24, partners- 1  Other Topics Concern  . Not on file  Social History Narrative  . Not on file   Social Determinants of Health   Financial Resource Strain: Not on file  Food Insecurity: Not on file  Transportation Needs: Not on file  Physical Activity: Not on file  Stress: Not on file  Social Connections: Not on file   Family History  Problem Relation Age of Onset  . Diabetes Mother   . Hypertension Mother   . Hypertension Father   . Hyperlipidemia Father   . Heart attack Maternal Uncle   . Diabetes Maternal Grandmother   . Diabetes Paternal Grandfather   . Stroke Paternal Grandfather   . Breast cancer Neg Hx    No Known Allergies Current Outpatient Medications  Medication Sig Dispense Refill  . cetirizine (ZYRTEC) 10 MG tablet Take 10 mg by mouth daily.    . norethindrone-ethinyl estradiol-FE (JUNEL FE 1/20) 1-20 MG-MCG tablet Take 1 tablet by mouth daily. 84 tablet 4   No current facility-administered medications for this visit.  No results found.  Review of Systems:   A ROS was performed including pertinent positives and negatives as documented in the HPI.  Physical Exam :   Constitutional: NAD and appears stated age Neurological: Alert and oriented Psych: Appropriate affect and cooperative There were no vitals taken for this visit.   Comprehensive Musculoskeletal Exam:    Inspection Right Left  Skin No atrophy or gross abnormalities appreciated No atrophy or gross abnormalities appreciated  Palpation    Tenderness None None  Crepitus None None  Range of Motion    Flexion (passive) 120 120  Extension 30 30  IR 45 no pain 45  ER 45 45  Strength    Flexion  5/5 5/5  Extension 5/5 5/5  Special Tests    FABER Negative Negative  FADIR Negative Negative  ER Lag/Capsular Insufficiency Negative Negative  Instability  Negative Negative  Sacroiliac pain Negative  Negative   Instability    Generalized Laxity No No  Neurologic    sciatic, femoral, obturator nerves intact to light sensation  Vascular/Lymphatic    DP pulse 2+ 2+  Lumbar Exam    Patient has symmetric lumbar range of motion with negative pain referral to hip   Tenderness about the greater trochanter with radiation to the right buttocks.   Imaging:   Xray (4 views right hip): Normal  MRI lumbar spine: Normal  I personally reviewed and interpreted the radiographs.   Assessment:   41 y.o. female with pain today that is more focally localized to the right hip greater trochanter.  I described that I would be concerned about a gluteus medius tendinitis.  I described that tears are quite frequently unlikely in her age group.  Flat affect I recommended ultrasound-guided injection with steroid and physical therapy for strengthening of the hip. Plan :    -Right hip ultrasound-guided gluteus medius injection performed today after verbal consent obtained -Physical therapy ordered for strengthening of the hip    Procedure Note  Patient: Sabrina Richardson             Date of Birth: 02-06-1981           MRN: [id number]             Visit Date: 03/15/2022  Procedures: Visit Diagnoses: No diagnosis found.  Large Joint Inj: R greater trochanter on 03/15/2022 11:09 AM Indications: pain Details: 22 G 3.5 in needle, ultrasound-guided anterolateral approach  Arthrogram: No  Medications: 4 mL lidocaine 1 %; 80 mg triamcinolone acetonide 40 MG/ML Outcome: tolerated well, no immediate complications Procedure, treatment alternatives, risks and benefits explained, specific risks discussed. Consent was given by the patient. Immediately prior to procedure a time out was called to verify the correct patient, procedure, equipment, support staff and site/side marked as required. Patient was prepped and draped in the usual sterile fashion.             I personally saw and evaluated the patient, and participated in the management and treatment plan.  Huel Cote, MD Attending Physician, Orthopedic Surgery  This document was dictated using Dragon voice recognition software. A reasonable attempt at proof reading has been made to minimize errors.

## 2022-05-24 ENCOUNTER — Other Ambulatory Visit: Payer: Self-pay | Admitting: Family Medicine

## 2022-05-24 DIAGNOSIS — Z1231 Encounter for screening mammogram for malignant neoplasm of breast: Secondary | ICD-10-CM

## 2022-08-02 DIAGNOSIS — Z1231 Encounter for screening mammogram for malignant neoplasm of breast: Secondary | ICD-10-CM

## 2022-09-18 ENCOUNTER — Other Ambulatory Visit: Payer: Self-pay | Admitting: Family Medicine

## 2022-09-18 DIAGNOSIS — Z1231 Encounter for screening mammogram for malignant neoplasm of breast: Secondary | ICD-10-CM

## 2022-11-08 ENCOUNTER — Ambulatory Visit
Admission: RE | Admit: 2022-11-08 | Discharge: 2022-11-08 | Disposition: A | Discharge status: Home / Self Care | Payer: BC Managed Care – PPO | Source: Ambulatory Visit

## 2022-11-08 ENCOUNTER — Other Ambulatory Visit: Payer: Self-pay

## 2022-11-08 ENCOUNTER — Encounter: Payer: Self-pay | Admitting: Allergy

## 2022-11-08 ENCOUNTER — Ambulatory Visit: Payer: BC Managed Care – PPO | Admitting: Allergy

## 2022-11-08 VITALS — BP 114/72 | HR 77 | Temp 98.7°F | Resp 16 | Ht 61.81 in | Wt 133.1 lb

## 2022-11-08 DIAGNOSIS — J3089 Other allergic rhinitis: Secondary | ICD-10-CM

## 2022-11-08 DIAGNOSIS — K9049 Malabsorption due to intolerance, not elsewhere classified: Secondary | ICD-10-CM | POA: Diagnosis not present

## 2022-11-08 DIAGNOSIS — Z1231 Encounter for screening mammogram for malignant neoplasm of breast: Secondary | ICD-10-CM

## 2022-11-08 DIAGNOSIS — H1013 Acute atopic conjunctivitis, bilateral: Secondary | ICD-10-CM

## 2022-11-08 MED ORDER — RYALTRIS 665-25 MCG/ACT NA SUSP: 2.0000 | Freq: Two times a day (BID) | NASAL | 5 refills | Status: DC | PRN

## 2022-11-08 MED ORDER — LEVOCETIRIZINE DIHYDROCHLORIDE 5 MG PO TABS: 5.0000 mg | ORAL_TABLET | Freq: Every evening | ORAL | 5 refills | Status: DC

## 2022-11-08 MED ORDER — OLOPATADINE HCL 0.2 % OP SOLN: 1.0000 [drp] | Freq: Every day | OPHTHALMIC | 5 refills | Status: DC | PRN

## 2022-11-08 NOTE — Progress Notes (Signed)
New Patient Note  RE: Sabrina Richardson MRN: [id number] DOB: [birth year] Date of Office Visit: 11/08/2022   Primary care provider: London Pepper, MD  Chief Complaint: allergies  History of present illness: Sabrina Richardson is a 42 y.o. female presenting today for evaluation of allergic rhinitis.   She states last year she had increase in allergy symptoms that seems to be worse with changing weather.  She states before last year she could get over the symtpoms pretty easily but last year was worse and she did require seeing her PCP for symptoms.  She states she could take alkaseltzer with good relief but that has not been working.  She reports symptoms of sinus pressure, nasal congestion, nasal drainage with throat clearing, sneezing.  Less itchy/watery eyes.  Does not report history of sinus infections.  She has been recommended to use a nasal spray, flonase and was helpful to a degree.  She has taken zyrtec which seems to help sometimes as well.   No history of asthma, eczema.  She does avoid dairy due to lactose intolerance.  Review of systems: Review of Systems  Constitutional: Negative.   HENT:         See HPI  Eyes: Negative.   Respiratory: Negative.    Cardiovascular: Negative.   Gastrointestinal: Negative.   Musculoskeletal: Negative.   Skin: Negative.   Allergic/Immunologic: Negative.   Neurological: Negative.     All other systems negative unless noted above in HPI  Past medical history: Past Medical History:  Diagnosis Date   Atypical chest pain 04/16/2021   Chlamydia contact, treated 2009   Elevated blood pressure reading 01/20/2020   Migraines 2007   per pt none now   Palpitations 04/15/2021   PCOS (polycystic ovarian syndrome)     Past surgical history: Past Surgical History:  Procedure Laterality Date   CESAREAN SECTION N/A 11/18/2019   Procedure: CESAREAN SECTION;  Surgeon: Servando Salina, MD;  Location: [facility];  Service: Obstetrics;  Laterality:  N/A;   NO PAST SURGERIES      Family history:  Family History  Problem Relation Age of Onset   Diabetes Mother    Hypertension Mother    Hypertension Father    Hyperlipidemia Father    Heart attack Maternal Uncle    Diabetes Maternal Grandmother    Diabetes Paternal Grandfather    Stroke Paternal Grandfather    Breast cancer Neg Hx     Social history: Lives in a Home with carpeting in the bedroom with gas heating and central cooling.  Dog in the home.  There is no concern for water damage, mildew or roaches in the home.  She is a Pharmacist, hospital.  She denies a smoking history.     Medication List: Current Outpatient Medications  Medication Sig Dispense Refill   cetirizine (ZYRTEC) 10 MG tablet Take 10 mg by mouth daily.     norethindrone-ethinyl estradiol-FE (JUNEL FE 1/20) 1-20 MG-MCG tablet Take 1 tablet by mouth daily. 84 tablet 4   No current facility-administered medications for this visit.    Known medication allergies: No Known Allergies   Physical examination: Blood pressure 114/72, pulse 77, temperature 98.7 F (37.1 C), temperature source Temporal, resp. rate 16, height 5' 1.81" (1.57 m), weight 133 lb 1.6 oz (60.4 kg), SpO2 100 %.  General: Alert, interactive, in no acute distress. HEENT: PERRLA, TMs pearly gray, turbinates mildly edematous without discharge, post-pharynx non erythematous. Neck: Supple without lymphadenopathy. Lungs: Clear to auscultation without wheezing, rhonchi  or rales. {no increased work of breathing. CV: Normal S1, S2 without murmurs. Abdomen: Nondistended, nontender. Skin: Warm and dry, without lesions or rashes. Extremities:  No clubbing, cyanosis or edema. Neuro:   Grossly intact.  Diagnositics/Labs:  Allergy testing:   Airborne Adult Perc - 11/08/22 1406     Time Antigen Placed 1406    Allergen Manufacturer Lavella Hammock    Location Back    Number of Test 58    Panel 1 Select    1. Control-Buffer 50% Glycerol Negative    2.  Control-Histamine 1 mg/ml 2+    3. Albumin saline Negative    4. [place] Negative    5. [place] Negative    6. Johnson Negative    7. [place] Blue Negative    9. Perennial Rye Negative    10. Sweet Vernal Negative    11. Timothy Negative    12. Cocklebur Negative    13. Burweed Marshelder 2+    14. Ragweed, short Negative    15. Ragweed, Giant 2+    16. Plantain,  English Negative    17. Lamb's Quarters Negative    18. Sheep Sorrell Negative    19. Rough Pigweed Negative    20. Marsh Elder, Rough Negative    21. Mugwort, Common Negative    22. Ash mix Negative    23. Birch mix Negative    24. Beech American Negative    25. Box, Elder Negative    26. Cedar, red Negative    27. Cottonwood, [place] Negative    28. Elm mix Negative    29. Hickory 3+    30. Maple mix Negative    31. Oak, [place] mix Negative    32. Pecan Pollen Negative    33. Pine mix Negative    34. Sycamore Eastern Negative    35. [place], Black Pollen Negative    36. Alternaria alternata Negative    37. Cladosporium Herbarum Negative    38. Aspergillus mix Negative    39. Penicillium mix Negative    40. Bipolaris sorokiniana (Helminthosporium) Negative    41. Drechslera spicifera (Curvularia) 2+    42. Mucor plumbeus 2+    43. Fusarium moniliforme Negative    44. Aureobasidium pullulans (pullulara) Negative    45. Rhizopus oryzae Negative    46. Botrytis cinera Negative    47. Epicoccum nigrum Negative    48. Phoma betae Negative    49. Candida Albicans Negative    50. Trichophyton mentagrophytes Negative    51. Mite, D Farinae  5,000 AU/ml Negative    52. Mite, D Pteronyssinus  5,000 AU/ml 2+    53. Cat Hair 10,000 BAU/ml Negative    54.  Dog Epithelia Negative    55. Mixed Feathers Negative    56. Horse Epithelia Negative    57. Cockroach, German Negative    58. Mouse Negative    59. Tobacco Leaf Negative             Intradermal - 11/08/22 1442     Time Antigen Placed 1443    Allergen  Manufacturer Lavella Hammock    Location Arm    Number of Test 8    Intradermal Select    Control Negative    7 Grass Negative    Mold 1 Negative    Mold 2 Negative    Mold 4 2+    Cat Negative    Dog Negative    Cockroach Negative  Allergy testing results were read and interpreted by provider, documented by clinical staff.   Assessment and plan:   Allergic rhinitis with conjunctivitis - Testing today showed: ragweed, weeds, trees, outdoor molds, and dust mites. - Copy of test results provided.  - Avoidance measures provided. - Start taking: Xyzal (levocetirizine) 5mg  tablet once daily.  This is a long-acting antihistamine that is usually pretty effective and may be more effective than Zyrtec.  Ryaltris (olopatadine/mometasone) two sprays per nostril 1-2 times daily as needed for nasal congestion and drainage.  This is a combination spray with olopatadine, antihistamine,  for drainage control and mometasone, steroid, for congestion control.   Pataday 1 drop each eye daily as needed for itchy/watery eyes.  - You can use an extra dose of the antihistamine, if needed, for breakthrough symptoms.  - Consider nasal saline rinses 1-2 times daily to remove allergens from the nasal cavities as well as help with mucous clearance (this is especially helpful to do before the nasal sprays are given). - Consider allergy shots as a means of long-term control if medication management is not effective enough. - Allergy shots "re-train" and "reset" the immune system to ignore environmental allergens and decrease the resulting immune response to those allergens (sneezing, itchy watery eyes, runny nose, nasal congestion, etc).    - Allergy shots improve symptoms in 75-85% of patients.   Food intolerance - Continue avoidance of dairy products unless lactose free or if using lactase enzyme prior to dairy ingestion.    Follow-up in 4-6 months or sooner if needed  I appreciate the opportunity to  take part in Ruthann's care. Please do not hesitate to contact me with questions.  Sincerely,   Prudy Feeler, MD Allergy/Immunology [facility] [place]

## 2022-11-08 NOTE — Patient Instructions (Signed)
Allergic rhinitis with conjunctivitis - Testing today showed: ragweed, weeds, trees, outdoor molds, and dust mites. - Copy of test results provided.  - Avoidance measures provided. - Start taking: Xyzal (levocetirizine) 5mg  tablet once daily.  This is a long-acting antihistamine that is usually pretty effective and may be more effective than Zyrtec.  Ryaltris (olopatadine/mometasone) two sprays per nostril 1-2 times daily as needed for nasal congestion and drainage.  This is a combination spray with olopatadine, antihistamine,  for drainage control and mometasone, steroid, for congestion control.   Pataday 1 drop each eye daily as needed for itchy/watery eyes.  - You can use an extra dose of the antihistamine, if needed, for breakthrough symptoms.  - Consider nasal saline rinses 1-2 times daily to remove allergens from the nasal cavities as well as help with mucous clearance (this is especially helpful to do before the nasal sprays are given). - Consider allergy shots as a means of long-term control if medication management is not effective enough. - Allergy shots "re-train" and "reset" the immune system to ignore environmental allergens and decrease the resulting immune response to those allergens (sneezing, itchy watery eyes, runny nose, nasal congestion, etc).    - Allergy shots improve symptoms in 75-85% of patients.   Food intolerance - Continue avoidance of dairy products unless lactose free or if using lactase enzyme prior to dairy ingestion.    Follow-up in 4-6 months or sooner if needed

## 2022-11-10 ENCOUNTER — Other Ambulatory Visit: Payer: Self-pay | Admitting: Family Medicine

## 2022-11-10 DIAGNOSIS — R928 Other abnormal and inconclusive findings on diagnostic imaging of breast: Secondary | ICD-10-CM

## 2022-11-24 ENCOUNTER — Ambulatory Visit
Admission: RE | Admit: 2022-11-24 | Discharge: 2022-11-24 | Disposition: A | Discharge status: Home / Self Care | Payer: BC Managed Care – PPO | Source: Ambulatory Visit | Attending: Family Medicine | Admitting: Family Medicine

## 2022-11-24 DIAGNOSIS — R928 Other abnormal and inconclusive findings on diagnostic imaging of breast: Secondary | ICD-10-CM

## 2022-12-26 ENCOUNTER — Encounter: Payer: Self-pay | Admitting: Obstetrics & Gynecology

## 2022-12-26 ENCOUNTER — Ambulatory Visit (INDEPENDENT_AMBULATORY_CARE_PROVIDER_SITE_OTHER): Payer: BC Managed Care – PPO | Admitting: Obstetrics & Gynecology

## 2022-12-26 VITALS — BP 112/68 | HR 74 | Ht 61.25 in | Wt 132.0 lb

## 2022-12-26 DIAGNOSIS — Z01419 Encounter for gynecological examination (general) (routine) without abnormal findings: Secondary | ICD-10-CM

## 2022-12-26 DIAGNOSIS — Z3169 Encounter for other general counseling and advice on procreation: Secondary | ICD-10-CM

## 2022-12-26 NOTE — Progress Notes (Signed)
Jaylene Switala Jun 18, 1981 [id number]   History:    42 y.o. G1P1L1 Son is 92 yo.  C/S 11/2019.   RP:  Established patient presenting for annual gyn exam    HPI: Stopped Junel Fe 1/20 a month ago to attempt conception.  LMP 12/19/22 normal.  No BTB.  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  C/S for arrest of progression with Dr Cherly Hensen.  Will return to Dr Cherly Hensen for preconception and Ob care. Pelvic US Normal 02/10/23 with PCOS appearance of Lt Ovary. Pap Neg 12/2021. No h/o abnormal Pap.  Will repeat Pap at 3 years. Breasts normal.  MMG: bilateral 11-08-22 Lt Neg & Rt Dx Mammo/ Breast US Neg 11-24-22.  BMI 24.74.  Health labs with Fam MD.  Alen Bleacher 2023 Polyp removed.  Fam h/o Colon Ca.   Past medical history,surgical history, family history and social history were all reviewed and documented in the EPIC chart.  Gynecologic History Patient's last menstrual period was 12/19/2022 (exact date).  Obstetric History OB History  Gravida Para Term Preterm AB Living  1 1 1     1   SAB IAB Ectopic Multiple Live Births        0 1    # Outcome Date GA Lbr Len/2nd Weight Sex Delivery Anes PTL Lv  1 Term 11/18/19 [redacted]w[redacted]d  7 lb 12 oz (3.515 kg) M CS-LTranv EPI  LIV     ROS: A ROS was performed and pertinent positives and negatives are included in the history. GENERAL: No fevers or chills. HEENT: No change in vision, no earache, sore throat or sinus congestion. NECK: No pain or stiffness. CARDIOVASCULAR: No chest pain or pressure. No palpitations. PULMONARY: No shortness of breath, cough or wheeze. GASTROINTESTINAL: No abdominal pain, nausea, vomiting or diarrhea, melena or bright red blood per rectum. GENITOURINARY: No urinary frequency, urgency, hesitancy or dysuria. MUSCULOSKELETAL: No joint or muscle pain, no back pain, no recent trauma. DERMATOLOGIC: No rash, no itching, no lesions. ENDOCRINE: No polyuria, polydipsia, no heat or cold intolerance. No recent change in weight.  HEMATOLOGICAL: No anemia or easy bruising or bleeding. NEUROLOGIC: No headache, seizures, numbness, tingling or weakness. PSYCHIATRIC: No depression, no loss of interest in normal activity or change in sleep pattern.     Exam:   BP 112/68   Pulse 74   Ht 5' 1.25" (1.556 m)   Wt 132 lb (59.9 kg)   LMP 12/19/2022 (Exact Date)   SpO2 98%   BMI 24.74 kg/m   Body mass index is 24.74 kg/m.  General appearance : Well developed well nourished female. No acute distress HEENT: Eyes: no retinal hemorrhage or exudates,  Neck supple, trachea midline, no carotid bruits, no thyroidmegaly Lungs: Clear to auscultation, no rhonchi or wheezes, or rib retractions  Heart: Regular rate and rhythm, no murmurs or gallops Breast:Examined in sitting and supine position were symmetrical in appearance, no palpable masses or tenderness,  no skin retraction, no nipple inversion, no nipple discharge, no skin discoloration, no axillary or supraclavicular lymphadenopathy Abdomen: no palpable masses or tenderness, no rebound or guarding Extremities: no edema or skin discoloration or tenderness  Pelvic: Vulva: Normal             Vagina: No gross lesions or discharge  Cervix: No gross lesions or discharge  Uterus  AV, normal size, shape and consistency, non-tender and mobile  Adnexa  Without masses or tenderness  Anus: Normal   Assessment/Plan:  42 y.o. female for annual exam  1. Well female exam with routine gynecological exam Stopped Junel Fe 1/20 a month ago to attempt conception.  LMP 12/19/22 normal.  No BTB.  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  C/S for arrest of progression with Dr Cherly Hensen.  Will return to Dr Cherly Hensen for preconception and Ob care. Pelvic US Normal 02/10/23 with PCOS appearance of Lt Ovary. Pap Neg 12/2021. No h/o abnormal Pap.  Will repeat Pap at 3 years. Breasts normal.  MMG: bilateral 11-08-22 Lt Neg & Rt Dx Mammo/ Breast US Neg 11-24-22.  BMI 24.74.  Health labs with  Fam MD.  Alen Bleacher 2023 Polyp removed.  Fam h/o Colon Ca.  2. Encounter for preconception consultation  Stopped Junel Fe 1/20 a month ago to attempt conception.  LMP 12/19/22 normal.  No BTB.  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  C/S for arrest of progression with Dr Cherly Hensen.  AMA/PCOS risks reviewed.  Will return to Dr Cherly Hensen ASAP for preconception and Ob care. Pelvic US Normal 02/10/23 with PCOS appearance of Lt Ovary.  Start PNVs, fitness and healthy nutrition.  Genia Del MD, 8:44 AM

## 2023-03-23 ENCOUNTER — Other Ambulatory Visit: Payer: Self-pay | Admitting: Obstetrics & Gynecology

## 2023-03-23 NOTE — Telephone Encounter (Signed)
Pt returned call and LVM stating that she does not desire refill. Rx request refused. Routed to provider for final review and encounter closed.

## 2023-03-23 NOTE — Telephone Encounter (Signed)
Medication refill request: blisovi fe 1/20 Last AEX:  12-26-22 Next AEX: not scheduled Last MMG (if hormonal medication request): bilateral & rt breast u/s 3/24 & 4/24 birads 1:neg Refill authorized: Left message for patient to callback. At aex patient did not want refill. She wanted to start trying to conceive.

## 2023-04-21 ENCOUNTER — Other Ambulatory Visit: Payer: Self-pay | Admitting: Allergy

## 2023-05-17 ENCOUNTER — Ambulatory Visit: Payer: BC Managed Care – PPO | Admitting: Allergy

## 2023-08-06 ENCOUNTER — Encounter: Payer: Self-pay | Admitting: Allergy

## 2023-08-06 ENCOUNTER — Other Ambulatory Visit: Payer: Self-pay

## 2023-08-06 ENCOUNTER — Ambulatory Visit: Payer: BC Managed Care – PPO | Admitting: Allergy

## 2023-08-06 VITALS — BP 108/64 | HR 96 | Temp 98.1°F

## 2023-08-06 DIAGNOSIS — H1013 Acute atopic conjunctivitis, bilateral: Secondary | ICD-10-CM

## 2023-08-06 DIAGNOSIS — K9049 Malabsorption due to intolerance, not elsewhere classified: Secondary | ICD-10-CM | POA: Diagnosis not present

## 2023-08-06 DIAGNOSIS — J3089 Other allergic rhinitis: Secondary | ICD-10-CM

## 2023-08-06 MED ORDER — LEVOCETIRIZINE DIHYDROCHLORIDE 5 MG PO TABS: 5.0000 mg | ORAL_TABLET | Freq: Every evening | ORAL | 5 refills | Status: DC

## 2023-08-06 MED ORDER — RYALTRIS 665-25 MCG/ACT NA SUSP: 2.0000 | Freq: Two times a day (BID) | NASAL | 5 refills | Status: DC | PRN

## 2023-08-06 NOTE — Progress Notes (Signed)
Follow-up Note  RE: Sabrina Richardson MRN: [id number] DOB: 1981-03-03 Date of Office Visit: 08/06/2023   History of present illness: Sabrina Richardson is a 42 y.o. female presenting today for follow-up of allergic rhinitis with conjunctivitis and food intolerance.  She was last seen in the office on 11/08/2022 by myself.  Discussed the use of AI scribe software for clinical note transcription with the patient, who gave verbal consent to proceed.  History of Present Illness   The patient, with a history of allergies to pollens, weeds, tree pollens, outdoor mold, and dust mites, presents with a recurrence of sinus-related symptoms.  She has experienced a worsening of symptoms, particularly with the onset of colder weather. The patient reports discomfort in the sinus nasal bridge area, reminiscent of previous episodes that led to initial allergy diagnosis.  In the past week the patient began using Ryaltris in anticipation of the nasal sinus symptoms, which she believes has been beneficial. However, she reports ongoing sinus drainage, causing both physical and mental fatigue. She has been using Xyzal at night to manage symptoms of coughing due to drainage, which she reports has been effective in preventing nocturnal coughing episodes. She has not been consistent in using Ryaltris and Xyzal concurrently.  She has not needed to use any eye drops recently.  The patient acknowledges inconsistency in medication use, often discontinuing when symptoms improve. She expresses a willingness to use Ryaltris more consistently, particularly in the lead-up to the spring season. She requests refills for Ryaltris and Xyzal.  She does continue to avoid lactose containing products due to lactose intolerance.    Review of systems: 10pt ROS negative unless noted above in HPI   All other systems negative unless noted above in HPI  Past medical/social/surgical/family history have been reviewed and are unchanged unless  specifically indicated below.  No changes  Medication List: Current Outpatient Medications  Medication Sig Dispense Refill   levocetirizine (XYZAL) 5 MG tablet TAKE 1 TABLET BY MOUTH EVERY EVENING 30 tablet 0   Olopatadine HCl (PATADAY) 0.2 % SOLN Place 1 drop into both eyes daily as needed. 2.5 mL 5   Olopatadine-Mometasone (RYALTRIS) 665-25 MCG/ACT SUSP Place 2 sprays into the nose 2 (two) times daily as needed (congestion or drainage). 29 g 5   cetirizine (ZYRTEC) 10 MG tablet Take 10 mg by mouth daily.     No current facility-administered medications for this visit.     Known medication allergies: No Known Allergies   Physical examination: Blood pressure 108/64, pulse 96, temperature 98.1 F (36.7 C), temperature source Temporal, SpO2 100%.  General: Alert, interactive, in no acute distress. HEENT: PERRLA, TMs pearly gray, turbinates moderately edematous with clear discharge, post-pharynx non erythematous. Neck: Supple without lymphadenopathy. Lungs: Clear to auscultation without wheezing, rhonchi or rales. {no increased work of breathing. CV: Normal S1, S2 without murmurs. Abdomen: Nondistended, nontender. Skin: Warm and dry, without lesions or rashes. Extremities:  No clubbing, cyanosis or edema. Neuro:   Grossly intact.  Diagnositics/Labs: None today  Assessment and plan:   Allergic rhinitis with conjunctivitis - Continue avoidance measures for ragweed, weeds, trees, outdoor molds, and dust mites. - Continue: Xyzal (levocetirizine) 5mg  tablet once daily.   Ryaltris (olopatadine/mometasone) two sprays per nostril 1-2 times daily as needed for nasal congestion and drainage.  This is a combination spray with olopatadine, antihistamine,  for drainage control and mometasone, steroid, for congestion control.   Pataday 1 drop each eye daily as needed for itchy/watery eyes.  - You  can use an extra dose of the antihistamine, if needed, for breakthrough symptoms.  - Consider  nasal saline rinses 1-2 times daily to remove allergens from the nasal cavities as well as help with mucous clearance (this is especially helpful to do before the nasal sprays are given). - Consider allergy shots as a means of long-term control if medication management is not effective enough. - Allergy shots "re-train" and "reset" the immune system to ignore environmental allergens and decrease the resulting immune response to those allergens (sneezing, itchy watery eyes, runny nose, nasal congestion, etc).    - Allergy shots improve symptoms in 75-85% of patients.   Food intolerance - Continue avoidance of dairy products unless lactose free or if using lactase enzyme prior to dairy ingestion.    Follow-up in 6 months or sooner if needed  I appreciate the opportunity to take part in Arti's care. Please do not hesitate to contact me with questions.  Sincerely,   Margo Aye, MD Allergy/Immunology [facility]

## 2023-08-06 NOTE — Patient Instructions (Addendum)
Allergic rhinitis with conjunctivitis - Continue avoidance measures for ragweed, weeds, trees, outdoor molds, and dust mites. - Continue: Xyzal (levocetirizine) 5mg  tablet once daily.   Ryaltris (olopatadine/mometasone) two sprays per nostril 1-2 times daily as needed for nasal congestion and drainage.  This is a combination spray with olopatadine, antihistamine,  for drainage control and mometasone, steroid, for congestion control.   Pataday 1 drop each eye daily as needed for itchy/watery eyes.  - You can use an extra dose of the antihistamine, if needed, for breakthrough symptoms.  - Consider nasal saline rinses 1-2 times daily to remove allergens from the nasal cavities as well as help with mucous clearance (this is especially helpful to do before the nasal sprays are given). - Consider allergy shots as a means of long-term control if medication management is not effective enough. - Allergy shots "re-train" and "reset" the immune system to ignore environmental allergens and decrease the resulting immune response to those allergens (sneezing, itchy watery eyes, runny nose, nasal congestion, etc).    - Allergy shots improve symptoms in 75-85% of patients.   Food intolerance - Continue avoidance of dairy products unless lactose free or if using lactase enzyme prior to dairy ingestion.    Follow-up in 6 months or sooner if needed

## 2023-08-09 ENCOUNTER — Encounter: Payer: Self-pay | Admitting: Obstetrics and Gynecology

## 2023-08-09 ENCOUNTER — Ambulatory Visit: Payer: BC Managed Care – PPO | Admitting: Obstetrics and Gynecology

## 2023-08-09 VITALS — BP 122/82 | HR 85 | Ht 61.75 in | Wt 138.0 lb

## 2023-08-09 DIAGNOSIS — N979 Female infertility, unspecified: Secondary | ICD-10-CM

## 2023-08-09 DIAGNOSIS — R7989 Other specified abnormal findings of blood chemistry: Secondary | ICD-10-CM

## 2023-08-09 DIAGNOSIS — E282 Polycystic ovarian syndrome: Secondary | ICD-10-CM

## 2023-08-09 NOTE — Progress Notes (Unsigned)
42 y.o. G74P1001 female here for family planning. Married. 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 Pt states she stopped OCP in April 2024. Currently not using any contraceptives. Would like to discuss a birth control alternative.   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  C/S for arrest of progression with Dr Cherly Hensen.  Pregnancy complicated by elevated blood pressures.  Elevated creatinine also noted over the last 5 years. Fam Hx of colon cancer  01/2022 TVUS:  The left ovary is slightly enlarged compatible with history of PCOS, but normal in appearance with positive perfusion to the left ovary.   Patient's last menstrual period was 07/16/2023 (approximate). Period Duration (Days): 5 Period Pattern: Regular Menstrual Flow: Light, Moderate Menstrual Control: Maxi pad, Tampon Dysmenorrhea: (!) Mild Dysmenorrhea Symptoms: Nausea, Diarrhea, Headache  Birth control: none Last mammogram: 11/24/22 BIRADS 1, density c Sexually active: yes, however not scheduling intercourse   GYN HISTORY: PCOS, infertility  OB History  Gravida Para Term Preterm AB Living  1 1 1   1   SAB IAB Ectopic Multiple Live Births     0 1    # Outcome Date GA Lbr Len/2nd Weight Sex Type Anes PTL Lv  1 Term 11/18/19 [redacted]w[redacted]d  7 lb 12 oz (3.515 kg) M CS-LTranv EPI  LIV    Past Medical History:  Diagnosis Date  . Atypical chest pain 04/16/2021  . Chlamydia contact, treated 2009  . Elevated blood pressure reading 01/20/2020  . Migraines 2007   per pt none now  . Palpitations 04/15/2021  . PCOS (polycystic ovarian syndrome)     Past Surgical History:  Procedure Laterality Date  . CESAREAN SECTION N/A 11/18/2019   Procedure: CESAREAN SECTION;  Surgeon: Maxie Better, MD;  Location: [facility];  Service: Obstetrics;  Laterality: N/A;  . NO PAST SURGERIES      Current Outpatient Medications on File Prior to Visit  Medication Sig Dispense Refill  . levocetirizine (XYZAL) 5 MG tablet Take 1 tablet (5 mg  total) by mouth every evening. 30 tablet 5  . Olopatadine HCl (PATADAY) 0.2 % SOLN Place 1 drop into both eyes daily as needed. 2.5 mL 5  . Olopatadine-Mometasone (RYALTRIS) X543819 MCG/ACT SUSP Place 2 sprays into the nose 2 (two) times daily as needed (congestion or drainage). 29 g 5   No current facility-administered medications on file prior to visit.    No Known Allergies    PE Today's Vitals   08/09/23 1028  BP: 122/82  Pulse: 85  SpO2: 98%  Weight: 138 lb (62.6 kg)  Height: 5' 1.75" (1.568 m)   Body mass index is 25.45 kg/m.  Physical Exam Vitals reviewed.  Constitutional:      General: She is not in acute distress.    Appearance: Normal appearance.  HENT:     Head: Normocephalic and atraumatic.     Nose: Nose normal.  Eyes:     Extraocular Movements: Extraocular movements intact.     Conjunctiva/sclera: Conjunctivae normal.  Pulmonary:     Effort: Pulmonary effort is normal.  Musculoskeletal:        General: Normal range of motion.     Cervical back: Normal range of motion.  Neurological:     General: No focal deficit present.     Mental Status: She is alert.  Psychiatric:        Mood and Affect: Mood normal.        Behavior: Behavior normal.      Assessment and Plan:  PCOS (polycystic ovarian syndrome) -     Testos,Total,Free and SHBG (Female) -     Hemoglobin A1C w/out eAG  Infertility, female -     Anti-Mullerian Hormone [facility]), Female -     Ambulatory referral to Endocrinology  Elevated serum creatinine -     Basic metabolic panel     Rosalyn Gess, MD

## 2023-08-12 LAB — ANTI-MULLERIAN HORMONE (AMH), FEMALE: Anti-Mullerian Hormones(AMH), Female: 19.08 ng/mL — ABNORMAL HIGH (ref 0.01–2.99)

## 2023-08-13 ENCOUNTER — Encounter: Payer: Self-pay | Admitting: Allergy

## 2023-08-13 LAB — BASIC METABOLIC PANEL
BUN: 17 mg/dL (ref 7–25)
CO2: 27 mmol/L (ref 20–32)
Calcium: 9.4 mg/dL (ref 8.6–10.2)
Chloride: 103 mmol/L (ref 98–110)
Creat: 0.95 mg/dL (ref 0.50–0.99)
Glucose, Bld: 75 mg/dL (ref 65–99)
Potassium: 4.2 mmol/L (ref 3.5–5.3)
Sodium: 138 mmol/L (ref 135–146)

## 2023-08-13 LAB — HEMOGLOBIN A1C W/OUT EAG: Hgb A1c MFr Bld: 5.4 %{Hb} (ref <5.7)

## 2023-08-13 LAB — TESTOS,TOTAL,FREE AND SHBG (FEMALE)
Free Testosterone: 7 pg/mL — ABNORMAL HIGH (ref 0.1–6.4)
Sex Hormone Binding: 65.5 nmol/L (ref 17–124)
Testosterone, Total, LC-MS-MS: 81 ng/dL — ABNORMAL HIGH (ref 2–45)

## 2023-08-16 DIAGNOSIS — N979 Female infertility, unspecified: Secondary | ICD-10-CM | POA: Insufficient documentation

## 2023-08-16 NOTE — Assessment & Plan Note (Addendum)
 Reviewed health implications including menstrual irregularity, subfertility, and risk of metabolic syndrome. BP and weight normal today. Will check AMH, A1c and testosterone levels. Metformin  would be indicated if A1c is elevated. Reviewed that given history of PCOS, history of infertility, and advanced maternal age, I would recommend referral to REI for infertility management. Will also check BMP as creatinine was slightly elevated during prior pregnancy. Encouraged prenatal vitamins. Questions answered.

## 2023-10-26 ENCOUNTER — Other Ambulatory Visit: Payer: Self-pay | Admitting: Family Medicine

## 2023-10-26 DIAGNOSIS — Z1231 Encounter for screening mammogram for malignant neoplasm of breast: Secondary | ICD-10-CM

## 2023-11-17 ENCOUNTER — Other Ambulatory Visit: Payer: Self-pay | Admitting: Allergy

## 2023-11-26 ENCOUNTER — Ambulatory Visit
Admission: RE | Admit: 2023-11-26 | Discharge: 2023-11-26 | Disposition: A | Discharge status: Home / Self Care | Payer: Self-pay | Source: Ambulatory Visit

## 2023-11-26 DIAGNOSIS — Z1231 Encounter for screening mammogram for malignant neoplasm of breast: Secondary | ICD-10-CM

## 2024-02-06 ENCOUNTER — Ambulatory Visit: Payer: BC Managed Care – PPO | Admitting: Allergy

## 2024-02-06 ENCOUNTER — Other Ambulatory Visit: Payer: Self-pay

## 2024-02-06 ENCOUNTER — Encounter: Payer: Self-pay | Admitting: Allergy

## 2024-02-06 VITALS — BP 120/64 | HR 87 | Temp 97.9°F | Resp 16 | Ht 61.25 in | Wt 144.0 lb

## 2024-02-06 DIAGNOSIS — K9049 Malabsorption due to intolerance, not elsewhere classified: Secondary | ICD-10-CM

## 2024-02-06 DIAGNOSIS — H1013 Acute atopic conjunctivitis, bilateral: Secondary | ICD-10-CM | POA: Diagnosis not present

## 2024-02-06 DIAGNOSIS — J3089 Other allergic rhinitis: Secondary | ICD-10-CM | POA: Diagnosis not present

## 2024-02-06 MED ORDER — LEVOCETIRIZINE DIHYDROCHLORIDE 5 MG PO TABS: 5.0000 mg | ORAL_TABLET | Freq: Every day | ORAL | 11 refills | Status: AC

## 2024-02-06 MED ORDER — OLOPATADINE HCL 0.2 % OP SOLN: 1.0000 [drp] | Freq: Every day | OPHTHALMIC | 5 refills | Status: AC | PRN

## 2024-02-06 MED ORDER — RYALTRIS 665-25 MCG/ACT NA SUSP: 2.0000 | Freq: Two times a day (BID) | NASAL | 5 refills | Status: AC | PRN

## 2024-02-06 NOTE — Patient Instructions (Addendum)
 Allergic rhinitis with conjunctivitis - Continue avoidance measures for ragweed, weeds, trees, outdoor molds, and dust mites. - Continue: Xyzal  (levocetirizine) 5mg  tablet once daily as needed Ryaltris  (olopatadine /mometasone) two sprays per nostril 1-2 times daily as needed for nasal congestion and drainage.  This is a combination spray with olopatadine , antihistamine,  for drainage control and mometasone, steroid, for congestion control.   Pataday  1 drop each eye daily as needed for itchy/watery eyes.  - You can use an extra dose of the antihistamine, if needed, for breakthrough symptoms.  - Consider nasal saline rinses 1-2 times daily to remove allergens from the nasal cavities as well as help with mucous clearance (this is especially helpful to do before the nasal sprays are given). - Consider allergy  shots as a means of long-term control if medication management is not effective enough. - Allergy  shots re-train and reset the immune system to ignore environmental allergens and decrease the resulting immune response to those allergens (sneezing, itchy watery eyes, runny nose, nasal congestion, etc).    - Allergy  shots improve symptoms in 75-85% of patients.   Food intolerance - Continue avoidance of dairy products unless lactose free or if using lactase enzyme prior to dairy ingestion.    Follow-up in 12 months or sooner if needed

## 2024-02-06 NOTE — Progress Notes (Signed)
 Follow-up Note  RE: Sabrina Richardson MRN: [id number] DOB: 25-Jul-1981 Date of Office Visit: 02/06/2024   History of present illness: Sabrina Richardson is a 43 y.o. female presenting today for follow-up of allergic rhinitis with conjunctivitis and food intolerance.  She was last seen in the office on 08/06/23 by myself.   Discussed the use of AI scribe software for clinical note transcription with the patient, who gave verbal consent to proceed.  She experiences recurrent loss of voice approximately once a year, typically in the spring. There are no accompanying symptoms such as illness during these episodes. She suspects a link to allergies as she has no other sick symptoms at the time.  She states she will pay more attention next time to see which season it tends to happen more often.  She has been using a nasal spray, Ryaltris , more frequently than when she first received it, which she believes has been beneficial.  She continues to take Xyzal .  She has not required use of any eyedrops yet.  She does continue to avoid dairy products in the most part but if she is consuming she will take a Lactaid pill.  Review of systems: 10pt ROS negative unless noted above in HPI   Past medical/social/surgical/family history have been reviewed and are unchanged unless specifically indicated below.  No changes  Medication List: Current Outpatient Medications  Medication Sig Dispense Refill   cholecalciferol (VITAMIN D3) 25 MCG (1000 UNIT) tablet Take 1,000 Units by mouth daily.     levocetirizine (XYZAL ) 5 MG tablet TAKE 1 TABLET(5 MG) BY MOUTH EVERY EVENING 30 tablet 5   metFORMIN  (GLUCOPHAGE ) 1000 MG tablet Take 1,000 mg by mouth 2 (two) times daily.     Olopatadine  HCl (PATADAY ) 0.2 % SOLN Place 1 drop into both eyes daily as needed. 2.5 mL 5   Olopatadine -Mometasone (RYALTRIS ) 665-25 MCG/ACT SUSP Place 2 sprays into the nose 2 (two) times daily as needed (congestion or drainage). 29 g 5    Prenatal Vit-Fe Fumarate-FA (PRENATAL VITAMIN PO) Take by mouth.     No current facility-administered medications for this visit.     Known medication allergies: No Known Allergies   Physical examination: Blood pressure 120/64, pulse 87, temperature 97.9 F (36.6 C), temperature source Temporal, resp. rate 16, height 5' 1.25 (1.556 m), weight 144 lb (65.3 kg), SpO2 100%.  General: Alert, interactive, in no acute distress. HEENT: PERRLA, TMs pearly gray, turbinates non-edematous without discharge, post-pharynx non erythematous. Neck: Supple without lymphadenopathy. Lungs: Clear to auscultation without wheezing, rhonchi or rales. {no increased work of breathing. CV: Normal S1, S2 without murmurs. Abdomen: Nondistended, nontender. Skin: Warm and dry, without lesions or rashes. Extremities:  No clubbing, cyanosis or edema. Neuro:   Grossly intact.  Diagnostics/Labs: None today  Assessment and plan:   Allergic rhinitis with conjunctivitis - Continue avoidance measures for ragweed, weeds, trees, outdoor molds, and dust mites. - Continue: Xyzal  (levocetirizine) 5mg  tablet once daily as needed Ryaltris  (olopatadine /mometasone) two sprays per nostril 1-2 times daily as needed for nasal congestion and drainage.  This is a combination spray with olopatadine , antihistamine,  for drainage control and mometasone, steroid, for congestion control.   Pataday  1 drop each eye daily as needed for itchy/watery eyes.  - You can use an extra dose of the antihistamine, if needed, for breakthrough symptoms.  - Consider nasal saline rinses 1-2 times daily to remove allergens from the nasal cavities as well as help with mucous clearance (this is especially helpful  to do before the nasal sprays are given). - Consider allergy  shots as a means of long-term control if medication management is not effective enough. - Allergy  shots re-train and reset the immune system to ignore environmental allergens and  decrease the resulting immune response to those allergens (sneezing, itchy watery eyes, runny nose, nasal congestion, etc).    - Allergy  shots improve symptoms in 75-85% of patients.   Food intolerance - Continue avoidance of dairy products unless lactose free or if using lactase enzyme prior to dairy ingestion.    Follow-up in 12 months or sooner if needed  I appreciate the opportunity to take part in Sabrina Richardson's care. Please do not hesitate to contact me with questions.  Sincerely,   Danita Brain, MD Allergy /Immunology [facility]

## 2024-02-28 ENCOUNTER — Telehealth: Payer: Self-pay | Admitting: *Deleted

## 2024-02-28 DIAGNOSIS — Z3201 Encounter for pregnancy test, result positive: Secondary | ICD-10-CM

## 2024-02-28 NOTE — Telephone Encounter (Signed)
 Call returned to patient. Patient reports faint positive UPT 02/19/24, followed by neg test daily after. LMP 02/23/24. States she had some cramping after faint positive, thought this may have been to the metformin . Hx of PCOS, trying to conceive.   Denies breast pain/tenderness, urinary frequency, N/V, fever/chills, bleeding or pain.   Last AEX 12/26/22 OV 08/09/23  Patient asking if she can come in for blood pregnancy test to confirm?   Advised I will review with Dr. Dallie and f/u, patient agreeable.   Dr. Dallie -please review.

## 2024-02-29 ENCOUNTER — Other Ambulatory Visit

## 2024-02-29 DIAGNOSIS — Z3201 Encounter for pregnancy test, result positive: Secondary | ICD-10-CM

## 2024-02-29 NOTE — Telephone Encounter (Signed)
 Spoke with patient.  Lab appt scheduled for today at 1000 for Beta Hcg.   Routing to provider for final review. Patient is agreeable to disposition. Will close encounter.

## 2024-03-01 LAB — HCG, QUANTITATIVE, PREGNANCY: HCG, Total, QN: <5 m[IU]/mL

## 2024-03-04 ENCOUNTER — Ambulatory Visit: Payer: Self-pay | Admitting: Obstetrics and Gynecology

## 2024-03-25 ENCOUNTER — Telehealth: Payer: Self-pay

## 2024-03-25 DIAGNOSIS — N926 Irregular menstruation, unspecified: Secondary | ICD-10-CM

## 2024-03-25 NOTE — Telephone Encounter (Signed)
 Patient called and states that she has missed her period for the month of Aug and has had 3 consecutive positive pregnancy test. Patient is requesting to be scheduled for lab to confirm pregnancy.   LMP: 02/23/24  Last AEX 12/26/22 OV 08/09/23  Patient is having mild nausea and some vomiting. She is also having dull pain in the stomach. She denies fever or chills.   Advised I would review with Dr. Dallie and follow up with recommendations.

## 2024-03-27 NOTE — Telephone Encounter (Signed)
 Call placed to patient, left detailed message, ok per dpr.  Advised per Dr. Dallie. Return call to office at [phone], option 1 to schedule appointment; Opt 4 if any additional questions.   Order placed.

## 2024-03-28 ENCOUNTER — Other Ambulatory Visit

## 2024-03-28 DIAGNOSIS — N926 Irregular menstruation, unspecified: Secondary | ICD-10-CM

## 2024-03-28 LAB — PREGNANCY, URINE: Preg Test, Ur: POSITIVE — AB

## 2024-03-28 NOTE — Telephone Encounter (Signed)
 Per review of EPIC, lab completed 03/28/24.

## 2024-03-31 ENCOUNTER — Ambulatory Visit: Payer: Self-pay | Admitting: Obstetrics and Gynecology

## 2024-03-31 DIAGNOSIS — O219 Vomiting of pregnancy, unspecified: Secondary | ICD-10-CM

## 2024-03-31 MED ORDER — DOXYLAMINE-PYRIDOXINE 10-10 MG PO TBEC: 1.0000 | DELAYED_RELEASE_TABLET | Freq: Three times a day (TID) | ORAL | 2 refills | Status: DC | PRN

## 2024-04-01 ENCOUNTER — Ambulatory Visit: Admitting: Obstetrics and Gynecology

## 2024-04-01 ENCOUNTER — Encounter: Payer: Self-pay | Admitting: Obstetrics and Gynecology

## 2024-04-01 VITALS — BP 122/64 | HR 98 | Temp 98.3°F | Wt 138.0 lb

## 2024-04-01 DIAGNOSIS — Z3A00 Weeks of gestation of pregnancy not specified: Secondary | ICD-10-CM

## 2024-04-01 DIAGNOSIS — R7989 Other specified abnormal findings of blood chemistry: Secondary | ICD-10-CM | POA: Diagnosis not present

## 2024-04-01 DIAGNOSIS — O209 Hemorrhage in early pregnancy, unspecified: Secondary | ICD-10-CM

## 2024-04-01 LAB — HCG, QUANTITATIVE, PREGNANCY: HCG, Total, QN: 6 m[IU]/mL

## 2024-04-01 LAB — VITAMIN D 25 HYDROXY (VIT D DEFICIENCY, FRACTURES): Vit D, 25-Hydroxy: 52 ng/mL (ref 30–100)

## 2024-04-01 NOTE — Progress Notes (Signed)
 43 y.o. G27P1001 female with PCOS here for vaginal bleeding in early pregnancy. Married. 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  Patient's last menstrual period was 03/29/2024 (exact date).  At 08/09/2023 appointment, she reported: Pt states she stopped OCP in April 2024. Currently not using any contraceptives.  Cycles remain irregular.  She would like to conceive again and is interested in getting some information about PCOS and hormone levels.  They have not been scheduling intercourse around ovulation since she completed her contraceptives.  H/O PCOS, conceived after relative infertility, on Metformin .  Notes AMH was elevated during this time.  C/S for arrest of progression with Dr Rutherford.  Pregnancy complicated by elevated blood pressures.  Elevated creatinine also noted over the last 5 years. Fam Hx of colon cancer  01/2022 TVUS:  The left ovary is slightly enlarged compatible with history of PCOS, but normal in appearance with positive perfusion to the left ovary.   08/09/2023 AMH 19, testosterone 81 (elevated), normal A1c She was started metformin  by [facility] ([facility]) earlier this year.  No records received.  She reports vitamin D  level was low so she started an over-the-counter supplement.  She would like to have this rechecked.  Started trying for pregnancy June 2025.  Vaginal bleeding with confirmed pregnancy (+UPT) - started on Saturday 03/29/24. She is having moderate bleeding similar to normal menstrual. Still bleeding today.  Bleeding is like a light period. No abdominal pain.  She would like to continue to try for spontaneous conception with metformin .  GYN HISTORY: PCOS, infertility  OB History  Gravida Para Term Preterm AB Living  1 1 1   1   SAB IAB Ectopic Multiple Live Births     0 1    # Outcome Date GA Lbr Len/2nd Weight Sex Type Anes PTL Lv  1 Term 11/18/19 [redacted]w[redacted]d  7 lb 12 oz (3.515 kg) M CS-LTranv EPI  LIV    Past Medical History:  Diagnosis Date   Atypical chest pain 04/16/2021    Chlamydia contact, treated 2009   Elevated blood pressure reading 01/20/2020   Migraines 2007   per pt none now   Palpitations 04/15/2021   PCOS (polycystic ovarian syndrome)     Past Surgical History:  Procedure Laterality Date   CESAREAN SECTION N/A 11/18/2019   Procedure: CESAREAN SECTION;  Surgeon: Rutherford Gain, MD;  Location: [facility] LD ORS;  Service: Obstetrics;  Laterality: N/A;   NO PAST SURGERIES      Current Outpatient Medications on File Prior to Visit  Medication Sig Dispense Refill   cholecalciferol (VITAMIN D3) 25 MCG (1000 UNIT) tablet Take 1,000 Units by mouth daily.     levocetirizine (XYZAL ) 5 MG tablet Take 1 tablet (5 mg total) by mouth daily. 30 tablet 11   metFORMIN  (GLUCOPHAGE ) 1000 MG tablet Take 1,000 mg by mouth 2 (two) times daily.     Olopatadine  HCl (PATADAY ) 0.2 % SOLN Place 1 drop into both eyes daily as needed. 2.5 mL 5   Olopatadine -Mometasone (RYALTRIS ) 665-25 MCG/ACT SUSP Place 2 sprays into the nose 2 (two) times daily as needed (congestion or drainage). 29 g 5   Prenatal Vit-Fe Fumarate-FA (PRENATAL VITAMIN PO) Take by mouth.     Doxylamine -Pyridoxine  10-10 MG TBEC Take 1 tablet by mouth 3 (three) times daily as needed. (Patient not taking: Reported on 04/01/2024) 60 tablet 2   No current facility-administered medications on file prior to visit.    No Known Allergies    PE Today's Vitals   04/01/24  1201  BP: 122/64  Pulse: 98  Temp: 98.3 F (36.8 C)  TempSrc: Oral  SpO2: 99%  Weight: 138 lb (62.6 kg)   Body mass index is 25.86 kg/m.  Physical Exam Vitals reviewed.  Constitutional:      General: She is not in acute distress.    Appearance: Normal appearance.  HENT:     Head: Normocephalic and atraumatic.     Nose: Nose normal.  Eyes:     Extraocular Movements: Extraocular movements intact.     Conjunctiva/sclera: Conjunctivae normal.  Pulmonary:     Effort: Pulmonary effort is normal.  Abdominal:     General: There is no  distension.     Palpations: Abdomen is soft.     Tenderness: There is no abdominal tenderness.  Genitourinary:    Comments: Deferred Musculoskeletal:        General: Normal range of motion.     Cervical back: Normal range of motion.  Neurological:     General: No focal deficit present.     Mental Status: She is alert.  Psychiatric:        Mood and Affect: Mood normal.        Behavior: Behavior normal.     Comments: Appropriately tearful      Assessment and Plan:        Vaginal bleeding affecting early pregnancy -     hCG, quantitative, pregnancy Most likely a biochemical pregnancy. Will request records from Dr. Yalcinkaya. Continue metformin  and timed intercourse through the end of this year.  Discussed potential for ovulation induction at that time. Plan for follow-up December 2025 Continue prenatal vitamin All questions answered  Low vitamin D  level -     VITAMIN D  25 Hydroxy (Vit-D Deficiency, Fractures)   Vera LULLA Pa, MD

## 2024-04-07 ENCOUNTER — Ambulatory Visit: Payer: Self-pay | Admitting: Obstetrics and Gynecology

## 2024-08-04 ENCOUNTER — Ambulatory Visit: Admitting: Obstetrics and Gynecology

## 2024-08-04 ENCOUNTER — Encounter: Payer: Self-pay | Admitting: Obstetrics and Gynecology

## 2024-08-04 VITALS — Wt 143.0 lb

## 2024-08-04 DIAGNOSIS — E282 Polycystic ovarian syndrome: Secondary | ICD-10-CM

## 2024-08-04 DIAGNOSIS — N979 Female infertility, unspecified: Secondary | ICD-10-CM | POA: Diagnosis not present

## 2024-08-04 NOTE — Progress Notes (Signed)
 "  43 y.o. G67P1011 female with PCOS here for vaginal bleeding in early pregnancy. Married, 8 years. 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  Patient's last menstrual period was 07/21/2024 (exact date).   At 08/09/2023 appointment, she reported: Pt states she stopped OCP in April 2024. Currently not using any contraceptives.  Cycles remain irregular.  She would like to conceive again and is interested in getting some information about PCOS and hormone levels.  They have not been scheduling intercourse around ovulation since she completed her contraceptives.  H/O PCOS, conceived after relative infertility, on Metformin .  Notes AMH was elevated during this time.  C/S for arrest of progression with Dr Rutherford.  Pregnancy complicated by elevated blood pressures.  Elevated creatinine also noted over the last 5 years. Fam Hx of colon cancer  01/2022 TVUS:  The left ovary is slightly enlarged compatible with history of PCOS, but normal in appearance with positive perfusion to the left ovary.   08/09/2023 AMH 19, testosterone 81 (elevated), normal A1c She was started metformin  by [facility] ([facility]) earlier this year.  No records received.  She reports vitamin D  level was low so she started an over-the-counter supplement.  She would like to have this rechecked.  04/01/24: Started trying for pregnancy June 2025. Vaginal bleeding with confirmed pregnancy (+UPT) - started on Saturday 03/29/24 > biochemical  pregnancy  08/04/2024 : Today, she reports they are still trying but have only had appropriately timed IC in Nov. Was trying to increase metformin  to 2000mg  every day, but having symptoms, only taking 1g every day  She wants to continue trying with metformin  for now. She is having sx of ovulation but not testing. She is also interested in updated labs. 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  OB History  Gravida Para Term Preterm AB Living  2 1  1  1 1   SAB IAB Ectopic Multiple Live Births  1   0 1    # Outcome Date GA Lbr Len/2nd Weight Sex Type Anes PTL Lv  2 Term 11/18/19 [redacted]w[redacted]d  7 lb 12 oz (3.515 kg) M CS-LTranv EPI  LIV  1 SAB             Past Medical History:  Diagnosis Date   Atypical chest pain 04/16/2021   Chlamydia contact, treated 2009   Elevated blood pressure reading 01/20/2020   Migraines 2007   per pt none now   Palpitations 04/15/2021   PCOS (polycystic ovarian syndrome)     Past Surgical History:  Procedure Laterality Date   CESAREAN SECTION N/A 11/18/2019   Procedure: CESAREAN SECTION;  Surgeon: Rutherford Gain, MD;  Location: [facility];  Service: Obstetrics;  Laterality: N/A;   NO PAST SURGERIES      Current Outpatient Medications on File Prior to Visit  Medication Sig Dispense Refill   cholecalciferol (VITAMIN D3) 25 MCG (1000 UNIT) tablet Take 1,000 Units by mouth daily.     levocetirizine (XYZAL ) 5 MG tablet Take 1 tablet (5 mg total) by mouth daily. 30 tablet 11   metFORMIN  (GLUCOPHAGE ) 1000 MG tablet Take 1,000 mg by mouth 2 (two) times daily.     Olopatadine  HCl (PATADAY ) 0.2 % SOLN Place 1 drop into both eyes daily as needed. 2.5 mL 5   Olopatadine -Mometasone (RYALTRIS ) 665-25 MCG/ACT SUSP Place 2 sprays into the nose 2 (two) times daily as needed (congestion or drainage). 29 g 5   Prenatal Vit-Fe Fumarate-FA (PRENATAL  VITAMIN PO) Take by mouth.     No current facility-administered medications on file prior to visit.    Allergies  Allergen Reactions   Other       PE Today's Vitals   08/04/24 1148  Weight: 143 lb (64.9 kg)   Body mass index is 26.8 kg/m.  Physical Exam Vitals reviewed.  Constitutional:      General: She is not in acute distress.    Appearance: Normal appearance.  HENT:     Head: Normocephalic and atraumatic.     Nose: Nose normal.  Eyes:     Extraocular Movements: Extraocular movements intact.     Conjunctiva/sclera: Conjunctivae normal.  Pulmonary:      Effort: Pulmonary effort is normal.  Musculoskeletal:        General: Normal range of motion.     Cervical back: Normal range of motion.  Neurological:     General: No focal deficit present.     Mental Status: She is alert.  Psychiatric:        Mood and Affect: Mood normal.        Behavior: Behavior normal.      Assessment and Plan:        PCOS (polycystic ovarian syndrome) -     Anti-Mullerian Hormone (AMH), Female; Future -     Testos,Total,Free and SHBG (Female); Future  Infertility, female -     Anti-Mullerian Hormone (AMH), Female; Future -     Testos,Total,Free and SHBG (Female); Future  She wants to continue trying for conception unassisted at this time Recommend at home ovulation kits with timed intercourse Continue metformin  Continue PNV Follow up as needed  Vera LULLA Pa, MD  "

## 2024-08-07 LAB — ANTI-MULLERIAN HORMONE (AMH), FEMALE: Anti-Mullerian Hormones(AMH), Female: 14.2 ng/mL — ABNORMAL HIGH (ref 0.01–2.99)

## 2024-08-10 LAB — TESTOS,TOTAL,FREE AND SHBG (FEMALE)
Free Testosterone: 3.5 pg/mL (ref 0.1–6.4)
Sex Hormone Binding: 76 nmol/L (ref 17–124)
Testosterone, Total, LC-MS-MS: 44 ng/dL (ref 2–45)

## 2024-08-12 ENCOUNTER — Ambulatory Visit: Payer: Self-pay | Admitting: Obstetrics and Gynecology

## 2025-02-04 ENCOUNTER — Ambulatory Visit: Admitting: Allergy
